# Patient Record
Sex: Female | Born: 1955 | Race: White | Hispanic: No | Marital: Married | Smoking: Never smoker
Health system: Southern US, Community
[De-identification: ages and names within clinical notes are randomized; demographics above are authoritative.]

## PROBLEM LIST (undated history)

## (undated) DIAGNOSIS — E785 Hyperlipidemia, unspecified: Secondary | ICD-10-CM

## (undated) DIAGNOSIS — A6000 Herpesviral infection of urogenital system, unspecified: Secondary | ICD-10-CM

## (undated) DIAGNOSIS — I4510 Unspecified right bundle-branch block: Secondary | ICD-10-CM

## (undated) DIAGNOSIS — S4290XA Fracture of unspecified shoulder girdle, part unspecified, initial encounter for closed fracture: Secondary | ICD-10-CM

## (undated) DIAGNOSIS — B09 Unspecified viral infection characterized by skin and mucous membrane lesions: Secondary | ICD-10-CM

## (undated) DIAGNOSIS — K921 Melena: Secondary | ICD-10-CM

## (undated) DIAGNOSIS — K5900 Constipation, unspecified: Secondary | ICD-10-CM

## (undated) DIAGNOSIS — K219 Gastro-esophageal reflux disease without esophagitis: Secondary | ICD-10-CM

## (undated) DIAGNOSIS — C4491 Basal cell carcinoma of skin, unspecified: Secondary | ICD-10-CM

## (undated) DIAGNOSIS — R319 Hematuria, unspecified: Secondary | ICD-10-CM

## (undated) DIAGNOSIS — M549 Dorsalgia, unspecified: Secondary | ICD-10-CM

## (undated) HISTORY — DX: Hyperlipidemia, unspecified: E78.5

## (undated) HISTORY — DX: Fracture of unspecified shoulder girdle, part unspecified, initial encounter for closed fracture: S42.90XA

## (undated) HISTORY — DX: Unspecified viral infection characterized by skin and mucous membrane lesions: B09

## (undated) HISTORY — DX: Dorsalgia, unspecified: M54.9

## (undated) HISTORY — DX: Gastro-esophageal reflux disease without esophagitis: K21.9

## (undated) HISTORY — PX: BLEPHAROPLASTY: SUR158

## (undated) HISTORY — DX: Melena: K92.1

## (undated) HISTORY — DX: Basal cell carcinoma of skin, unspecified: C44.91

## (undated) HISTORY — PX: ABDOMINAL HYSTERECTOMY: SHX81

## (undated) HISTORY — PX: POLYPECTOMY: SHX149

## (undated) HISTORY — DX: Hematuria, unspecified: R31.9

## (undated) HISTORY — DX: Constipation, unspecified: K59.00

## (undated) HISTORY — DX: Herpesviral infection of urogenital system, unspecified: A60.00

## (undated) HISTORY — DX: Unspecified right bundle-branch block: I45.10

---

## 1999-09-23 ENCOUNTER — Encounter (INDEPENDENT_AMBULATORY_CARE_PROVIDER_SITE_OTHER): Payer: Self-pay

## 1999-09-23 ENCOUNTER — Inpatient Hospital Stay (HOSPITAL_COMMUNITY): Admission: RE | Admit: 1999-09-23 | Discharge: 1999-09-26 | Payer: Self-pay | Admitting: Obstetrics & Gynecology

## 2002-04-05 ENCOUNTER — Other Ambulatory Visit: Admission: RE | Admit: 2002-04-05 | Discharge: 2002-04-05 | Payer: Self-pay | Admitting: Obstetrics & Gynecology

## 2002-11-08 HISTORY — PX: BLADDER SUSPENSION: SHX72

## 2003-04-22 ENCOUNTER — Other Ambulatory Visit: Admission: RE | Admit: 2003-04-22 | Discharge: 2003-04-22 | Payer: Self-pay | Admitting: Obstetrics & Gynecology

## 2004-06-02 ENCOUNTER — Other Ambulatory Visit: Admission: RE | Admit: 2004-06-02 | Discharge: 2004-06-02 | Payer: Self-pay | Admitting: Obstetrics & Gynecology

## 2005-08-04 ENCOUNTER — Other Ambulatory Visit: Admission: RE | Admit: 2005-08-04 | Discharge: 2005-08-04 | Payer: Self-pay | Admitting: Obstetrics & Gynecology

## 2005-11-08 HISTORY — PX: COLONOSCOPY: SHX174

## 2007-08-31 ENCOUNTER — Ambulatory Visit: Payer: Self-pay | Admitting: Internal Medicine

## 2007-10-02 ENCOUNTER — Ambulatory Visit: Payer: Self-pay | Admitting: Internal Medicine

## 2009-10-13 ENCOUNTER — Emergency Department (HOSPITAL_COMMUNITY): Admission: EM | Admit: 2009-10-13 | Discharge: 2009-10-13 | Payer: Self-pay | Admitting: Emergency Medicine

## 2011-02-09 LAB — URINALYSIS, ROUTINE W REFLEX MICROSCOPIC
Glucose, UA: NEGATIVE mg/dL
Ketones, ur: NEGATIVE mg/dL
pH: 5.5 (ref 5.0–8.0)

## 2011-02-09 LAB — CBC
MCHC: 34.3 g/dL (ref 30.0–36.0)
Platelets: 186 10*3/uL (ref 150–400)
RDW: 12.5 % (ref 11.5–15.5)

## 2011-02-09 LAB — URINE CULTURE
Colony Count: NO GROWTH
Culture: NO GROWTH

## 2011-02-09 LAB — COMPREHENSIVE METABOLIC PANEL
ALT: 35 U/L (ref 0–35)
AST: 23 U/L (ref 0–37)
Calcium: 9.7 mg/dL (ref 8.4–10.5)
GFR calc Af Amer: >60 mL/min (ref >60)
Sodium: 142 mEq/L (ref 135–145)
Total Protein: 6.6 g/dL (ref 6.0–8.3)

## 2011-02-09 LAB — POCT CARDIAC MARKERS
CKMB, poc: 1.3 ng/mL (ref 1.0–8.0)
Myoglobin, poc: 54.3 ng/mL (ref 12–200)

## 2011-02-09 LAB — DIFFERENTIAL
Eosinophils Absolute: 0 10*3/uL (ref 0.0–0.7)
Eosinophils Relative: 0 % (ref 0–5)
Lymphs Abs: 2.1 10*3/uL (ref 0.7–4.0)
Monocytes Relative: 7 % (ref 3–12)

## 2011-02-09 LAB — PROTIME-INR: INR: 0.95 (ref 0.00–1.49)

## 2011-03-26 NOTE — Op Note (Signed)
[facility]  Patient:    Pamela Cameron                         MRN: [id number] Proc. Date: 09/23/99 Adm. Date:  [id number] Attending:  Minette Headland                           Operative Report  PREOPERATIVE DIAGNOSES:       Cystocele with prolapse and right paravaginal defect; rectocele with prolapse to the introitus; uterine enlargement; marked uterine descensus.  POSTOPERATIVE DIAGNOSES:      Cystocele with prolapse and right paravaginal defect; rectocele with prolapse to the introitus; uterine enlargement; marked uterine descensus.  OPERATIVE PROCEDURE:          Total abdominal hysterectomy; paravaginal repair;  posterior colporrhaphy.  SURGEON:                      Freddy Finner, M.D.  ASSISTANT:                    Guy Sandifer. Arleta Creek, M.D.  ANESTHESIA:  ESTIMATED INTRAOPERATIVE BLOOD LOSS:  250 to 300 cc.  INTRAOPERATIVE COMPLICATIONS:  None.  INDICATIONS:                  Details of the present illness are recorded in the admission note.  DESCRIPTION OF PROCEDURE:     Patient was admitted on the morning, she was placed in PAS hose and she was given IV bolus of cefotetan.  She was brought to the operating room, placed under adequate general endotracheal anesthesia and placed in the dorsal lithotomy position using the [place] stirrup system.  Betadine prep of  abdomen, perineum, upper thighs and vagina was carried out with Betadine scrub followed by solution.  A 30 cc Foley catheter was placed with a triple lumen. Sterile drapes were applied.  A lower abdominal transverse incision was made and carried sharply down to fascia, which was entered sharply, and extended to the extent of the skin incision.  Rectus sheath was developed superiorly and inferiorly with blunt and sharp dissection.  The rectus muscles were divided in the midline. Subcutaneous and subfascial vessels were controlled with a Bovie.  Peritoneum was entered  bluntly and extended sharply and bluntly to the extent of the skin incision.  Palpation of the upper abdomen revealed no apparent abnormalities of  liver, gallbladder, kidneys or other intra-abdominal abnormalities.  Appendix was inspected and found to be normal and left in place.  Moist packs were used to pack the intestinal contents out of the pelvis and a self-retaining OConnor-OSullivan retractor was used.  The proximal broad ligaments were grasped with Kellys. Round ligaments were taken with suture ligature of 0 Monocryl, divided sharply and the anterior leaf of the broad ligament incised elliptically; this freed the bladder from the lower segment.  Uteroovarian pedicles were developed, cross-clamped with curved Heaneys, divided and doubly ligated with free ties of 0 Monocryl followed by suture ligature of 0 Monocryl.  Vessel pedicles were skeletonized, cross-clamped with Heaneys, divided sharply and ligated with 0 Monocryl.  Straight Heaneys were used to develop the cardinal ligament pedicles; these were divided sharply and ligated with 0 Monocryl.  Curved Heaneys were placed on the uterosacrals on either side, divided sharply and ligated with 0 Monocryl in a Heaney fashion; this included the angle on the left side.  The vaginal angle on the right side was taken separately, divided and ligated with 0 Monocryl in a Heaney fashion, anchoring t to the uterosacral ligament.  Cervix was completely excised from the cuff. Cuff was closed with figure-of-eights of 0 Monocryl.  Plication sutures x 2 of 0 Monocryl were used in the uterosacrals to plicate them and elevate the vaginal apex.  Irrigation was carried out.  All bleeding sources were controlled with Bovie or with clamp and suture.  Each ovary was suspended from the round ligament with interrupted 0 Monocryl suture.  With adequate complete hemostasis, all packs, needles and instruments were removed from the abdomen.  The  peritoneum was closed with a running 0 Monocryl suture.  Extensive dissection in the space of Retzius and paravaginal space was carried out.  With digital vaginal exploration to identify the sponge on each side, the ischial spines were identified.  The dissection was carried down until the vagina was identified and any tissue stripped away from he vagina.  The white line was then identified on the right side and using a total of three paravaginal sutures, the vagina was elevated to the white line.  Each suture was placed while digitally exploring the vaginal canal and was placed into the vesicovaginal fistula and anchored to the white line.  Ethibond 0 sutures were sed on a UR-6 needle.  This was repeated on the left side, placing a suture approximately 2 cm lateral to the urethrovesical angle, which was identified by  placing traction on the 30 cc Foley bulb.  The urethrovesical angle was then anchored to Coopers ligament.  All sutures were then tied and this adequately elevated the bladder and the urethrovesical junction.  Bladder was filled with irrigating solution and a suprapubic Bonnano catheter placed without difficulty; this was anchored to the skin with 4-0 nylon.  Fascia was closed with running 0  PDS, running from angle-to-midline on either side.  Skin was closed with wide skin staples and quarter-inch Steri-Strips.  Foley catheter was removed.  Legs were elevated with the Allen stirrups and attention was turned to the vaginal repair.  The fourchette was grasped with Allis forceps.  A pyramidal-shaped segment of skin was removed from the perineal body with apex inferiorly.  With careful blunt and sharp dissection, the mucosa overlying the rectum was freed and an incision made posteriorly in the midline, while elevating the mucosa. Pararectal tissues were then approximated using interrupted sutures of 0 Monocryl; a total of three sutures were used for this as well  as a fourth suture for reapproximation of the levators.  Mucosa was then closed with a running 2-0 Monocryl suture in a fashion identical to an episiotomy repair.  Hemostasis was adequate.  All packs, needles and instruments were removed.  The patient tolerated the operative  procedure well and was taken to the recovery room in good condition.  DD: 09/23/99 TD:  09/23/99 Job: [id number]

## 2013-01-06 DIAGNOSIS — K921 Melena: Secondary | ICD-10-CM

## 2013-01-06 HISTORY — DX: Melena: K92.1

## 2013-01-16 ENCOUNTER — Telehealth: Payer: Self-pay | Admitting: Internal Medicine

## 2013-01-16 NOTE — Telephone Encounter (Signed)
Spoke with Leotis Shames and scheduled patient on 01/17/13 at 9:00 AM with Doug Sou, PA. Lauren to send records.

## 2013-01-17 ENCOUNTER — Encounter: Payer: Self-pay | Admitting: Gastroenterology

## 2013-01-17 ENCOUNTER — Ambulatory Visit (INDEPENDENT_AMBULATORY_CARE_PROVIDER_SITE_OTHER): Payer: BC Managed Care – PPO | Admitting: Gastroenterology

## 2013-01-17 ENCOUNTER — Encounter: Payer: Self-pay | Admitting: Internal Medicine

## 2013-01-17 VITALS — BP 100/50 | HR 66 | Ht 70.0 in | Wt 173.0 lb

## 2013-01-17 DIAGNOSIS — K922 Gastrointestinal hemorrhage, unspecified: Secondary | ICD-10-CM

## 2013-01-17 MED ORDER — MOVIPREP 100 G PO SOLR: 1.0000 | Freq: Once | ORAL | Status: AC

## 2013-01-17 NOTE — Progress Notes (Signed)
Reviewed and agree. Colonoscopy, but bleeding likely ano-rectal.

## 2013-01-17 NOTE — Progress Notes (Signed)
01/17/2013 Pamela Cameron [id number] 12-04-1955   HISTORY OF PRESENT ILLNESS:  Patient is a pleasant 57 year old female who is a patient of Dr. Regino Schultze.  She had a colonoscopy 5.5 years ago in 09/2007 at which time it was normal, but prep with Miralax was sub-optimal.  She says about a month ago she saw some BRB on the TP.  She saw her PCP who gave her a prescription for anusol suppositories to use, however, she said that she does not like to use anything unless absolutely necessary.  Her PCP also recommended that she be evaluated for a repeat colonoscopy.  She says that she had another episode of bleeding 2 days ago.  None in stool or in the bowl that she can tell.  No abdominal pain or rectal discomfort.  Stools are normal/regular for her without diarrhea or constipation.  Denies family history of colon CA.   Past Medical History  Diagnosis Date  . Carpal tunnel syndrome   . Skin cancer    Past Surgical History  Procedure Laterality Date  . Colonoscopy  2007  . Abdominal hysterectomy      Total hysterectomy with bladder tuck    reports that she has never smoked. She has never used smokeless tobacco. She reports that  drinks alcohol. She reports that she does not use illicit drugs. family history includes CVA in her mother; Emphysema in her father; Hypertension in her mother; and Lung cancer in her mother. No Known Allergies    No outpatient encounter prescriptions on file as of 01/17/2013.   No facility-administered encounter medications on file as of 01/17/2013.     REVIEW OF SYSTEMS  : All other systems reviewed and negative except where noted in the History of Present Illness.   PHYSICAL EXAM: BP 100/50  Pulse 66  Ht 5\' 10"  (1.778 m)  Wt 173 lb (78.472 kg)  BMI 24.82 kg/m2 General: Well developed white female in no acute distress Head: Normocephalic and atraumatic Eyes:  sclerae anicteric,conjunctive pink. Ears: Normal auditory acuity Lungs: Clear throughout to  auscultation Heart: Regular rate and rhythm Abdomen: Soft, nontender, non-distended. No masses or hepatomegaly noted. Normal bowel sounds Rectal: Deferred.  Will be done at the time of colonoscopy. Musculoskeletal: Symmetrical with no gross deformities  Skin: No lesions on visible extremities Extremities: No edema  Neurological: Alert oriented x 4, grossly nonfocal Psychological:  Alert and cooperative. Normal mood and affect  ASSESSMENT AND PLAN: -LGIB:  Intermittent bright red blood C/W outlet bleeding.  Patient's last colonoscopy was 5.5 years ago.  Her PCP recommended colonoscopy and patient would like piece of mind as well.  Last colonoscopy prep was sub-optimal.  Will schedule colonoscopy for re-evaluation.

## 2013-01-17 NOTE — Patient Instructions (Addendum)
You have been scheduled for a colonoscopy with propofol. Please follow written instructions given to you at your visit today.  Please pick up your prep kit at the pharmacy within the next 1-3 days. If you use inhalers (even only as needed), please bring them with you on the day of your procedure.  We sent the prescription for the Moviprep to [facility] [address]

## 2013-01-20 DIAGNOSIS — R319 Hematuria, unspecified: Secondary | ICD-10-CM

## 2013-01-20 HISTORY — DX: Hematuria, unspecified: R31.9

## 2013-02-06 ENCOUNTER — Encounter: Payer: Self-pay | Admitting: Internal Medicine

## 2013-02-06 ENCOUNTER — Ambulatory Visit (AMBULATORY_SURGERY_CENTER): Payer: BC Managed Care – PPO | Admitting: Internal Medicine

## 2013-02-06 VITALS — BP 131/77 | HR 61 | Temp 97.5°F | Resp 18 | Ht 70.0 in | Wt 173.0 lb

## 2013-02-06 DIAGNOSIS — K922 Gastrointestinal hemorrhage, unspecified: Secondary | ICD-10-CM

## 2013-02-06 DIAGNOSIS — D126 Benign neoplasm of colon, unspecified: Secondary | ICD-10-CM

## 2013-02-06 MED ORDER — SODIUM CHLORIDE 0.9 % IV SOLN: 500.0000 mL | INTRAVENOUS | Status: DC

## 2013-02-06 NOTE — Patient Instructions (Addendum)
YOU HAD AN ENDOSCOPIC PROCEDURE TODAY AT THE [facility]: Refer to the procedure report that was given to you for any specific questions about what was found during the examination.  If the procedure report does not answer your questions, please call your gastroenterologist to clarify.  If you requested that your care partner not be given the details of your procedure findings, then the procedure report has been included in a sealed envelope for you to review at your convenience later.  YOU SHOULD EXPECT: Some feelings of bloating in the abdomen. Passage of more gas than usual.  Walking can help get rid of the air that was put into your GI tract during the procedure and reduce the bloating. If you had a lower endoscopy (such as a colonoscopy or flexible sigmoidoscopy) you may notice spotting of blood in your stool or on the toilet paper. If you underwent a bowel prep for your procedure, then you may not have a normal bowel movement for a few days.  DIET: Your first meal following the procedure should be a light meal and then it is ok to progress to your normal diet.  A half-sandwich or bowl of soup is an example of a good first meal.  Heavy or fried foods are harder to digest and may make you feel nauseous or bloated.  Likewise meals heavy in dairy and vegetables can cause extra gas to form and this can also increase the bloating.  Drink plenty of fluids but you should avoid alcoholic beverages for 24 hours.  Eat a high fiber diet.  ACTIVITY: Your care partner should take you home directly after the procedure.  You should plan to take it easy, moving slowly for the rest of the day.  You can resume normal activity the day after the procedure however you should NOT DRIVE or use heavy machinery for 24 hours (because of the sedation medicines used during the test).    SYMPTOMS TO REPORT IMMEDIATELY: A gastroenterologist can be reached at any hour.  During normal business hours, 8:30 AM to 5:00 PM  Monday through Friday, call [phone].  After hours and on weekends, please call the GI answering service at [phone] who will take a message and have the physician on call contact you.   Following lower endoscopy (colonoscopy or flexible sigmoidoscopy):  Excessive amounts of blood in the stool  Significant tenderness or worsening of abdominal pains  Swelling of the abdomen that is new, acute  Fever of 100F or higher  FOLLOW UP: If any biopsies were taken you will be contacted by phone or by letter within the next 1-3 weeks.  Call your gastroenterologist if you have not heard about the biopsies in 3 weeks.  Our staff will call the home number listed on your records the next business day following your procedure to check on you and address any questions or concerns that you may have at that time regarding the information given to you following your procedure. This is a courtesy call and so if there is no answer at the home number and we have not heard from you through the emergency physician on call, we will assume that you have returned to your regular daily activities without incident.  SIGNATURES/CONFIDENTIALITY: You and/or your care partner have signed paperwork which will be entered into your electronic medical record.  These signatures attest to the fact that that the information above on your After Visit Summary has been reviewed and is understood.  Full   responsibility of the confidentiality of this discharge information lies with you and/or your care-partner. 

## 2013-02-06 NOTE — Op Note (Signed)
[facility] [address] [place], [address]   COLONOSCOPY PROCEDURE REPORT  PATIENT: Pamela Cameron, Pamela Cameron  MR#: [id number] BIRTHDATE: 04-05-1956 , 56  yrs. old GENDER: Female ENDOSCOPIST: Hart Carwin, MD REFERRED BY:  Theressa Millard, M.D. PROCEDURE DATE:  02/06/2013 PROCEDURE:   Colonoscopy with snare polypectomy ASA CLASS:   Class II INDICATIONS:hematochezia and colonoscopy 2008- suboptimal prep, pt asked to come back in 5 years. MEDICATIONS: MAC sedation, administered by CRNA, propofol (Diprivan) 500mg  IV, Atropine .4 mg IV, and Robinul 0.2 mg IV  DESCRIPTION OF PROCEDURE:   After the risks and benefits and of the procedure were explained, informed consent was obtained.  A digital rectal exam revealed no abnormalities of the rectum.    The LB CF-[id number]  endoscope was introduced through the anus and advanced to the cecum, which was identified by both the appendix and ileocecal valve .  The quality of the prep was good, using MoviPrep .  The instrument was then slowly withdrawn as the colon was fully examined.     COLON FINDINGS: A polypoid shaped pedunculated polyp measuring 20 mm in size with a friable surface was found in the sigmoid colon.  A polypectomy was performed using snare cautery.  The resection was complete and the polyp tissue was completely retrieved. Retroflexed views revealed no abnormalities.     The scope was then withdrawn from the patient and the procedure completed.  COMPLICATIONS: There were no complications. ENDOSCOPIC IMPRESSION: Pedunculated polyp measuring 20 mm in size was found in the sigmoid colon; polypectomy was performed using snare cautery , polyp was likely source of low volume hematochezia  RECOMMENDATIONS: 1.  Await pathology results 2.  high fiber diet   REPEAT EXAM: In 5 year(s)  for Colonoscopy. , pending pathology report  cc:  _______________________________ eSigned:  Hart Carwin, MD 02/06/2013  10:53 AM     PATIENT NAME:  Pamela Cameron, Pamela Cameron MR#: [id number]

## 2013-02-06 NOTE — Progress Notes (Addendum)
Patient did not have preoperative order for IV antibiotic SSI prophylaxis. (G8918)  Patient did not experience any of the following events: a burn prior to discharge; a fall within the facility; wrong site/side/patient/procedure/implant event; or a hospital transfer or hospital admission upon discharge from the facility. (G8907)  

## 2013-02-06 NOTE — Progress Notes (Signed)
Pt. First noticed blood in stool early March.  She visited Dr. After approx 3 episodes of bleeding.  Approx. Middle of march notice  Clots in urine on three separate occasions.

## 2013-02-07 ENCOUNTER — Telehealth: Payer: Self-pay

## 2013-02-07 NOTE — Telephone Encounter (Signed)
  Follow up Call-  Call back number 02/06/2013  Post procedure Call Back phone  # [phone]  Permission to leave phone message Yes     Patient questions:  Do you have a fever, pain , or abdominal swelling? no Pain Score  0 *  Have you tolerated food without any problems? yes  Have you been able to return to your normal activities? yes  Do you have any questions about your discharge instructions: Diet   no Medications  no Follow up visit  no  Do you have questions or concerns about your Care? no  Actions: * If pain score is 4 or above: No action needed, pain <4.

## 2013-02-08 ENCOUNTER — Encounter: Payer: Self-pay | Admitting: Internal Medicine

## 2013-02-13 ENCOUNTER — Encounter: Payer: Self-pay | Admitting: Internal Medicine

## 2013-02-14 ENCOUNTER — Encounter: Payer: Self-pay | Admitting: *Deleted

## 2013-02-27 ENCOUNTER — Telehealth: Payer: Self-pay | Admitting: Internal Medicine

## 2013-02-27 NOTE — Telephone Encounter (Signed)
Patient given results as per letter from 02/14/13. Mailed path results letter again. Patient understands to call me if she does not receive it.

## 2013-07-05 ENCOUNTER — Other Ambulatory Visit: Payer: Self-pay | Admitting: Dermatology

## 2013-12-04 ENCOUNTER — Other Ambulatory Visit: Payer: Self-pay | Admitting: Dermatology

## 2014-05-04 DIAGNOSIS — S4290XA Fracture of unspecified shoulder girdle, part unspecified, initial encounter for closed fracture: Secondary | ICD-10-CM

## 2014-05-04 HISTORY — DX: Fracture of unspecified shoulder girdle, part unspecified, initial encounter for closed fracture: S42.90XA

## 2014-07-17 ENCOUNTER — Other Ambulatory Visit: Payer: Self-pay | Admitting: Dermatology

## 2015-07-10 ENCOUNTER — Encounter: Payer: Self-pay | Admitting: Family Medicine

## 2015-07-10 ENCOUNTER — Ambulatory Visit (INDEPENDENT_AMBULATORY_CARE_PROVIDER_SITE_OTHER): Payer: Managed Care, Other (non HMO) | Admitting: Family Medicine

## 2015-07-10 VITALS — BP 96/60 | HR 86 | Temp 97.6°F | Ht 69.5 in | Wt 176.3 lb

## 2015-07-10 DIAGNOSIS — Z7189 Other specified counseling: Secondary | ICD-10-CM | POA: Diagnosis not present

## 2015-07-10 DIAGNOSIS — Z7689 Persons encountering health services in other specified circumstances: Secondary | ICD-10-CM

## 2015-07-10 DIAGNOSIS — Z23 Encounter for immunization: Secondary | ICD-10-CM | POA: Diagnosis not present

## 2015-07-10 DIAGNOSIS — M545 Low back pain, unspecified: Secondary | ICD-10-CM

## 2015-07-10 DIAGNOSIS — G8929 Other chronic pain: Secondary | ICD-10-CM | POA: Insufficient documentation

## 2015-07-10 DIAGNOSIS — Z0000 Encounter for general adult medical examination without abnormal findings: Secondary | ICD-10-CM

## 2015-07-10 NOTE — Patient Instructions (Signed)
BEFORE YOU LEAVE: -low back exercises -shingles vaccine today -schedule fasting lab appointment and nurse visit for flu vaccine in next few weeks -follow up yearly and in 1 month if back pain persists  Do the back exercises 4 days per week and follow up if persists in 4 weeks  -We have ordered labs or studies at this visit. It can take up to 1-2 weeks for results and processing. We will contact you with instructions IF your results are abnormal. Normal results will be released to your [facility]. If you have not heard from [place] or can not find your results in [facility] in 2 weeks please contact our office.  We recommend the following healthy lifestyle measures: - eat a healthy diet consisting of lots of vegetables, fruits, beans, nuts, seeds, healthy meats such as white chicken and fish and whole grains.  - avoid fried foods, fast food, processed foods, sodas, red meet and other fattening foods.  - get a least 150 minutes of aerobic exercise per week.
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
## 2015-07-10 NOTE — Progress Notes (Signed)
Pre visit review using our clinic review tool, if applicable. No additional management support is needed unless otherwise documented below in the visit note. 

## 2015-07-24 ENCOUNTER — Other Ambulatory Visit (INDEPENDENT_AMBULATORY_CARE_PROVIDER_SITE_OTHER): Payer: Managed Care, Other (non HMO)

## 2015-07-24 DIAGNOSIS — Z0000 Encounter for general adult medical examination without abnormal findings: Secondary | ICD-10-CM

## 2015-07-24 DIAGNOSIS — E785 Hyperlipidemia, unspecified: Secondary | ICD-10-CM

## 2015-07-24 LAB — TSH: TSH: 2.71 u[IU]/mL (ref 0.35–4.50)

## 2015-07-24 LAB — LIPID PANEL
CHOL/HDL RATIO: 3
Cholesterol: 252 mg/dL — ABNORMAL HIGH (ref 0–200)
HDL: 74.9 mg/dL (ref >39.00)
LDL Cholesterol: 162 mg/dL — ABNORMAL HIGH (ref 0–99)
NONHDL: 177.55
Triglycerides: 78 mg/dL (ref 0.0–149.0)
VLDL: 15.6 mg/dL (ref 0.0–40.0)

## 2015-07-24 LAB — HEMOGLOBIN A1C: HEMOGLOBIN A1C: 5.2 % (ref 4.6–6.5)

## 2015-11-19 ENCOUNTER — Other Ambulatory Visit (INDEPENDENT_AMBULATORY_CARE_PROVIDER_SITE_OTHER): Payer: Managed Care, Other (non HMO)

## 2015-11-19 DIAGNOSIS — E785 Hyperlipidemia, unspecified: Secondary | ICD-10-CM

## 2015-11-19 LAB — LIPID PANEL
CHOL/HDL RATIO: 4
Cholesterol: 239 mg/dL — ABNORMAL HIGH (ref 0–200)
HDL: 58.8 mg/dL (ref >39.00)
LDL Cholesterol: 162 mg/dL — ABNORMAL HIGH (ref 0–99)
NONHDL: 180.06
TRIGLYCERIDES: 88 mg/dL (ref 0.0–149.0)
VLDL: 17.6 mg/dL (ref 0.0–40.0)

## 2016-01-21 ENCOUNTER — Ambulatory Visit: Payer: Managed Care, Other (non HMO) | Admitting: Family Medicine

## 2016-02-04 ENCOUNTER — Ambulatory Visit (INDEPENDENT_AMBULATORY_CARE_PROVIDER_SITE_OTHER): Payer: Managed Care, Other (non HMO) | Admitting: Family Medicine

## 2016-02-04 ENCOUNTER — Encounter: Payer: Self-pay | Admitting: Family Medicine

## 2016-02-04 VITALS — BP 100/64 | HR 63 | Temp 97.9°F | Ht 69.5 in | Wt 174.7 lb

## 2016-02-04 DIAGNOSIS — E785 Hyperlipidemia, unspecified: Secondary | ICD-10-CM | POA: Diagnosis not present

## 2016-02-04 NOTE — Patient Instructions (Addendum)
BEFORE YOU LEAVE: -schedule lab visit in 2 months - come fasting  We recommend the following healthy lifestyle measures: - eat a healthy whole foods diet consisting of regular small meals composed of vegetables, fruits, beans, nuts, seeds, healthy meats such as white chicken and fish and whole grains.  - avoid sweets, white starchy foods, fried foods, fast food, processed foods, sodas, red meet and other fattening foods.  - get a least 150-300 minutes of aerobic exercise per week.

## 2016-02-04 NOTE — Progress Notes (Signed)
Pre visit review using our clinic review tool, if applicable. No additional management support is needed unless otherwise documented below in the visit note. 
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
## 2016-02-05 ENCOUNTER — Telehealth: Payer: Self-pay | Admitting: Internal Medicine

## 2016-02-05 NOTE — Telephone Encounter (Signed)
Spoke with patient and she is asking for Dr. Loletha Carrow as her new GI. She is calling because her sister recently was diagnosed with uterine cancer "after having blood in her stool one time." Patient states she remembers having BRB in her stool once 4-5 months ago.No bleeding since this time. She is concerned about this since she had a pre cancerous polyp with last colonoscopy. Reviewed colonoscopy results with patient and reassured her about her results. Offered to schedule OV but she declines.She understands to call if bleeding occurs again or has black stool, maroon stool, coffee like stool. Patient states she has been having lots of gas. Mailed out information on gas/gast prevention diet.

## 2016-04-09 ENCOUNTER — Other Ambulatory Visit: Payer: Managed Care, Other (non HMO)

## 2016-04-14 ENCOUNTER — Other Ambulatory Visit (INDEPENDENT_AMBULATORY_CARE_PROVIDER_SITE_OTHER): Payer: Managed Care, Other (non HMO)

## 2016-04-14 DIAGNOSIS — E785 Hyperlipidemia, unspecified: Secondary | ICD-10-CM

## 2016-04-14 LAB — LIPID PANEL
CHOL/HDL RATIO: 4
Cholesterol: 235 mg/dL — ABNORMAL HIGH (ref 0–200)
HDL: 59.4 mg/dL (ref >39.00)
LDL CALC: 157 mg/dL — AB (ref 0–99)
NonHDL: 175.94
TRIGLYCERIDES: 97 mg/dL (ref 0.0–149.0)
VLDL: 19.4 mg/dL (ref 0.0–40.0)

## 2016-09-06 ENCOUNTER — Other Ambulatory Visit: Payer: Managed Care, Other (non HMO)

## 2016-09-07 ENCOUNTER — Other Ambulatory Visit (INDEPENDENT_AMBULATORY_CARE_PROVIDER_SITE_OTHER): Payer: Managed Care, Other (non HMO)

## 2016-09-07 DIAGNOSIS — E785 Hyperlipidemia, unspecified: Secondary | ICD-10-CM | POA: Diagnosis not present

## 2016-09-07 LAB — LIPID PANEL
CHOL/HDL RATIO: 3
Cholesterol: 208 mg/dL — ABNORMAL HIGH (ref 0–200)
HDL: 63.4 mg/dL (ref >39.00)
LDL CALC: 129 mg/dL — AB (ref 0–99)
NONHDL: 144.47
Triglycerides: 77 mg/dL (ref 0.0–149.0)
VLDL: 15.4 mg/dL (ref 0.0–40.0)
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
## 2016-12-14 ENCOUNTER — Encounter: Payer: Self-pay | Admitting: Family Medicine

## 2016-12-14 ENCOUNTER — Ambulatory Visit (INDEPENDENT_AMBULATORY_CARE_PROVIDER_SITE_OTHER): Payer: Managed Care, Other (non HMO) | Admitting: Family Medicine

## 2016-12-14 VITALS — BP 100/64 | HR 71 | Temp 98.0°F | Ht 69.5 in | Wt 186.0 lb

## 2016-12-14 DIAGNOSIS — Z6827 Body mass index (BMI) 27.0-27.9, adult: Secondary | ICD-10-CM

## 2016-12-14 DIAGNOSIS — E785 Hyperlipidemia, unspecified: Secondary | ICD-10-CM

## 2016-12-14 NOTE — Progress Notes (Signed)
Pre visit review using our clinic review tool, if applicable. No additional management support is needed unless otherwise documented below in the visit note. 

## 2016-12-14 NOTE — Patient Instructions (Signed)
BEFORE YOU LEAVE: -follow up: Physical in 3 months with fasting labs that day   We recommend the following healthy lifestyle for LIFE: 1) Small portions.   Tip: eat off of a salad plate instead of a dinner plate.  Tip: if you need more or a snack choose fruits, veggies and/or a handful of nuts or seeds.  2) Eat a healthy clean diet.  * Tip: Avoid (less then 1 serving per week): processed foods, sweets, sweetened drinks, white starches (rice, flour, bread, potatoes, pasta, etc), red meat, fast foods, butter  *Tip: CHOOSE instead   * 5-9 servings per day of fresh or frozen fruits and vegetables (but not corn, potatoes, bananas, canned or dried fruit)   *nuts and seeds, beans   *olives and olive oil   *small portions of lean meats such as fish and white chicken    *small portions of whole grains  3)Get at least 150 minutes of sweaty aerobic exercise per week.  4)Reduce stress - consider counseling, meditation and relaxation to balance other aspects of your life.
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
## 2017-03-11 ENCOUNTER — Encounter: Payer: Self-pay | Admitting: Family Medicine

## 2017-03-11 ENCOUNTER — Ambulatory Visit (INDEPENDENT_AMBULATORY_CARE_PROVIDER_SITE_OTHER): Payer: 59 | Admitting: Family Medicine

## 2017-03-11 VITALS — BP 108/62 | HR 61 | Temp 97.6°F | Ht 69.5 in | Wt 186.1 lb

## 2017-03-11 DIAGNOSIS — Z0000 Encounter for general adult medical examination without abnormal findings: Secondary | ICD-10-CM

## 2017-03-11 DIAGNOSIS — Z7289 Other problems related to lifestyle: Secondary | ICD-10-CM

## 2017-03-11 DIAGNOSIS — E785 Hyperlipidemia, unspecified: Secondary | ICD-10-CM

## 2017-03-11 LAB — LIPID PANEL
CHOLESTEROL: 297 mg/dL — AB (ref 0–200)
HDL: 63.7 mg/dL (ref >39.00)
LDL Cholesterol: 205 mg/dL — ABNORMAL HIGH (ref 0–99)
NonHDL: 233.68
TRIGLYCERIDES: 145 mg/dL (ref 0.0–149.0)
Total CHOL/HDL Ratio: 5
VLDL: 29 mg/dL (ref 0.0–40.0)

## 2017-03-11 LAB — HEMOGLOBIN A1C: Hgb A1c MFr Bld: 5.5 % (ref 4.6–6.5)

## 2017-03-11 NOTE — Progress Notes (Signed)
Pre visit review using our clinic review tool, if applicable. No additional management support is needed unless otherwise documented below in the visit note. 

## 2017-03-11 NOTE — Patient Instructions (Signed)
BEFORE YOU LEAVE: -follow up: CPE in 1 year -labs  Vit D3 [phone] IU daily ([employer] brand was highly rated by consumer labs)  We have ordered labs or studies at this visit. It can take up to 1-2 weeks for results and processing. IF results require follow up or explanation, we will call you with instructions. Clinically stable results will be released to your [facility]. If you have not heard from [place] or cannot find your results in [facility] in 2 weeks please contact our office at [phone].  If you are not yet signed up for [facility], please consider signing up.   We recommend the following healthy lifestyle for LIFE: 1) Small portions.   Tip: eat off of a salad plate instead of a dinner plate.  Tip: It is ok to feel hungry after a meal of proper portion size  Tip: if you need more or a snack choose fruits, veggies and/or a handful of nuts or seeds.  2) Eat a healthy clean diet.  * Tip: Avoid (less then 1 serving per week): processed foods, sweets, sweetened drinks, white starches (rice, flour, bread, potatoes, pasta, etc), red meat, fast foods, butter  *Tip: CHOOSE instead   * 5-9 servings per day of fresh or frozen fruits and vegetables (but not corn, potatoes, bananas, canned or dried fruit)   *nuts and seeds, beans   *olives and olive oil   *small portions of lean meats such as fish and white chicken    *small portions of whole grains  3)Get at least 150 minutes of sweaty aerobic exercise per week.  4)Reduce stress - consider counseling, meditation and relaxation to balance other aspects of your life.   WE NOW OFFER   [facility] FAST TRACK!!!  SAME DAY Appointments for ACUTE CARE  Such as: Sprains, Injuries, cuts, abrasions, rashes, muscle pain, joint pain, back pain Colds, flu, sore throats, headache, allergies, cough, fever  Ear pain, sinus and eye infections Abdominal pain, nausea, vomiting, diarrhea, upset stomach Animal/insect bites  3 Easy Ways to  Schedule: Walk-In Scheduling Call in scheduling Mychart Sign-up: [email or web address]

## 2017-03-12 LAB — HEPATITIS C ANTIBODY: HCV AB: NEGATIVE

## 2017-03-15 MED ORDER — ROSUVASTATIN CALCIUM 20 MG PO TABS: 20.0000 mg | ORAL_TABLET | Freq: Every day | ORAL | 3 refills | Status: DC

## 2017-03-15 NOTE — Addendum Note (Signed)
Addended by: Agnes Lawrence on: 03/15/2017 03:03 PM   Modules accepted: Orders

## 2017-05-04 ENCOUNTER — Other Ambulatory Visit: Payer: Self-pay | Admitting: Family Medicine

## 2017-05-04 MED ORDER — ROSUVASTATIN CALCIUM 20 MG PO TABS: 20.0000 mg | ORAL_TABLET | Freq: Every day | ORAL | 2 refills | Status: DC

## 2017-05-04 NOTE — Telephone Encounter (Signed)
Received a fax from the pharmacy requesting a 90 day supply.  Sent to the pharmacy by e-scribe.  Pt had physical and labs in May 2018.  Asked to return in 1 year.

## 2017-05-30 ENCOUNTER — Telehealth: Payer: Self-pay | Admitting: Family Medicine

## 2017-05-30 ENCOUNTER — Encounter: Payer: Self-pay | Admitting: *Deleted

## 2017-05-30 NOTE — Telephone Encounter (Signed)
° ° °  Pt call to say she need a note stating that she is ok to have the yellow fever vaccine  [phone]

## 2017-05-30 NOTE — Telephone Encounter (Signed)
I am not sure what would require this type of note? Ok to type letter that states that:  "In regards to patient Pamela Cameron, DOB [birth year], I am not aware of any medical contraindications or increased risks for adverse events related to obtaining the Yellow Fever Vaccine other then her age of 10. Per CDC recommendations, patients > 62 years of age may be at increased risks for adverse effects related to this vaccine. Advise that the patient review and discuss vaccine indications, contraindications and possible adverse effects with the clinic or provider where she is receiving this vaccine."  Thanks.

## 2017-05-30 NOTE — Telephone Encounter (Signed)
I called the pt and read to her the message below.  Letter was completed and left at the front desk and the pt is aware she can pick this up.

## 2017-06-08 ENCOUNTER — Ambulatory Visit (INDEPENDENT_AMBULATORY_CARE_PROVIDER_SITE_OTHER): Payer: 59

## 2017-06-08 DIAGNOSIS — Z23 Encounter for immunization: Secondary | ICD-10-CM | POA: Diagnosis not present

## 2017-06-08 NOTE — Progress Notes (Signed)
Pt received her Hep B 3rd  injection today 06/08/2017 at 11am, Pt brought immunization records from [facility] and a copy was made. Pt stated that [place]. Given by Rosealee Albee CMA.

## 2017-07-14 ENCOUNTER — Other Ambulatory Visit (INDEPENDENT_AMBULATORY_CARE_PROVIDER_SITE_OTHER): Payer: 59

## 2017-07-14 DIAGNOSIS — E785 Hyperlipidemia, unspecified: Secondary | ICD-10-CM | POA: Diagnosis not present

## 2017-07-14 LAB — LIPID PANEL
CHOL/HDL RATIO: 3
Cholesterol: 148 mg/dL (ref 0–200)
HDL: 54.4 mg/dL (ref >39.00)
LDL Cholesterol: 74 mg/dL (ref 0–99)
NONHDL: 93.96
Triglycerides: 101 mg/dL (ref 0.0–149.0)
VLDL: 20.2 mg/dL (ref 0.0–40.0)
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
## 2017-07-18 ENCOUNTER — Ambulatory Visit (INDEPENDENT_AMBULATORY_CARE_PROVIDER_SITE_OTHER): Payer: 59 | Admitting: Family Medicine

## 2017-07-18 ENCOUNTER — Encounter: Payer: Self-pay | Admitting: Family Medicine

## 2017-07-18 VITALS — BP 90/68 | HR 70 | Temp 97.9°F | Ht 69.5 in | Wt 182.6 lb

## 2017-07-18 DIAGNOSIS — E785 Hyperlipidemia, unspecified: Secondary | ICD-10-CM | POA: Diagnosis not present

## 2017-07-18 NOTE — Patient Instructions (Signed)
BEFORE YOU LEAVE: -follow up: 6 months -set up nurse visit for flu shot  Continue the crestor, healthy diet and regular exercise!  We recommend the following healthy lifestyle for LIFE: 1) Small portions.   Tip: eat off of a salad plate instead of a dinner plate.  Tip: It is ok to feel hungry after a meal - that likely means you ate an appropriate portion.  Tip: if you need more or a snack choose fruits, veggies and/or a handful of nuts or seeds.  2) Eat a healthy clean diet.   TRY TO EAT: -at least 5-7 servings of low sugar vegetables per day (not corn, potatoes or bananas.) -berries are the best choice if you wish to eat fruit.   -lean meets (fish, chicken or [place] breasts) -vegan proteins for some meals - beans or tofu, whole grains, nuts and seeds -Replace bad fats with good fats - good fats include: fish, nuts and seeds, canola oil, olive oil -small amounts of low fat or non fat dairy -small amounts of100 % whole grains - check the lables  AVOID: -SUGAR, sweets, anything with added sugar, corn syrup or sweeteners -if you must have a sweetener, small amounts of stevia may be best -sweetened beverages -simple starches (rice, bread, potatoes, pasta, chips, etc - small amounts of 100% whole grains are ok) -red meat, pork, butter -fried foods, fast food, processed food, excessive dairy, eggs and coconut.  3)Get at least 150 minutes of sweaty aerobic exercise per week.  4)Reduce stress - consider counseling, meditation and relaxation to balance other aspects of your life.

## 2017-08-04 ENCOUNTER — Ambulatory Visit (INDEPENDENT_AMBULATORY_CARE_PROVIDER_SITE_OTHER): Payer: 59 | Admitting: *Deleted

## 2017-08-04 DIAGNOSIS — Z23 Encounter for immunization: Secondary | ICD-10-CM | POA: Diagnosis not present

## 2017-10-10 ENCOUNTER — Other Ambulatory Visit: Payer: Self-pay | Admitting: *Deleted

## 2017-10-10 MED ORDER — ROSUVASTATIN CALCIUM 20 MG PO TABS: 20.0000 mg | ORAL_TABLET | Freq: Every day | ORAL | 2 refills | Status: DC

## 2017-10-10 NOTE — Telephone Encounter (Signed)
Rx done. 

## 2017-12-21 ENCOUNTER — Encounter: Payer: Self-pay | Admitting: Gastroenterology

## 2018-01-16 ENCOUNTER — Ambulatory Visit: Payer: 59 | Admitting: Family Medicine
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
## 2018-02-14 ENCOUNTER — Encounter: Payer: Self-pay | Admitting: Family Medicine

## 2018-02-14 ENCOUNTER — Ambulatory Visit (INDEPENDENT_AMBULATORY_CARE_PROVIDER_SITE_OTHER): Payer: 59 | Admitting: Family Medicine

## 2018-02-14 VITALS — BP 110/68 | HR 71 | Temp 97.8°F | Ht 69.5 in | Wt 197.2 lb

## 2018-02-14 DIAGNOSIS — E785 Hyperlipidemia, unspecified: Secondary | ICD-10-CM | POA: Diagnosis not present

## 2018-02-14 DIAGNOSIS — Z6828 Body mass index (BMI) 28.0-28.9, adult: Secondary | ICD-10-CM | POA: Diagnosis not present

## 2018-02-14 DIAGNOSIS — K219 Gastro-esophageal reflux disease without esophagitis: Secondary | ICD-10-CM

## 2018-02-14 DIAGNOSIS — L719 Rosacea, unspecified: Secondary | ICD-10-CM | POA: Diagnosis not present

## 2018-02-14 NOTE — Patient Instructions (Signed)
Physical in about 3-4 months, we will plan to check labs then   We recommend the following healthy lifestyle for LIFE: 1) Small portions. But, make sure to get regular (at least 3 per day), healthy meals and small healthy snacks if needed.  2) Eat a healthy clean diet.   TRY TO EAT: -at least 5-7 servings of low sugar, colorful, and nutrient rich vegetables per day (not corn, potatoes or bananas.) -berries are the best choice if you wish to eat fruit (only eat small amounts if trying to reduce weight)  -lean meets (fish, white meat of chicken or [place]) -vegan proteins for some meals - beans or tofu, whole grains, nuts and seeds -Replace bad fats with good fats - good fats include: fish, nuts and seeds, canola oil, olive oil -small amounts of low fat or non fat dairy -small amounts of100 % whole grains - check the lables -drink plenty of water  AVOID: -SUGAR, sweets, anything with added sugar, corn syrup or sweeteners - must read labels as even foods advertised as "healthy" often are loaded with sugar -if you must have a sweetener, small amounts of stevia may be best -sweetened beverages and artificially sweetened beverages -simple starches (rice, bread, potatoes, pasta, chips, etc - small amounts of 100% whole grains are ok) -red meat, pork, butter -fried foods, fast food, processed food, excessive dairy, eggs and coconut.  3)Get at least 150 minutes of sweaty aerobic exercise per week.  4)Reduce stress - consider counseling, meditation and relaxation to balance other aspects of your life.

## 2018-02-15 ENCOUNTER — Ambulatory Visit (AMBULATORY_SURGERY_CENTER): Payer: Self-pay | Admitting: *Deleted

## 2018-02-15 ENCOUNTER — Encounter: Payer: Self-pay | Admitting: Gastroenterology

## 2018-02-15 ENCOUNTER — Other Ambulatory Visit: Payer: Self-pay

## 2018-02-15 VITALS — Ht 70.0 in | Wt 194.0 lb

## 2018-02-15 DIAGNOSIS — Z86010 Personal history of colonic polyps: Secondary | ICD-10-CM

## 2018-02-15 MED ORDER — NA SULFATE-K SULFATE-MG SULF 17.5-3.13-1.6 GM/177ML PO SOLN: 1.0000 | Freq: Once | ORAL | 0 refills | Status: AC

## 2018-02-15 NOTE — Progress Notes (Signed)
No egg or soy allergy known to patient  No issues with past sedation with any surgeries  or procedures, no intubation problems  No diet pills per patient No home 02 use per patient  No blood thinners per patient  Pt denies issues with constipation  No A fib or A flutter  EMMI video sent to pt's e mail - pt declined  Pt states her last 2 colons she had sub optimal preps and she is requesting a more extensive prep- gave her a 2 day suprep at her request

## 2018-03-01 ENCOUNTER — Ambulatory Visit (AMBULATORY_SURGERY_CENTER): Payer: 59 | Admitting: Gastroenterology

## 2018-03-01 ENCOUNTER — Encounter: Payer: Self-pay | Admitting: Gastroenterology

## 2018-03-01 ENCOUNTER — Other Ambulatory Visit: Payer: Self-pay

## 2018-03-01 VITALS — BP 107/58 | HR 55 | Temp 97.3°F | Resp 11 | Ht 70.0 in | Wt 194.0 lb

## 2018-03-01 DIAGNOSIS — Z860100 Personal history of colon polyps, unspecified: Secondary | ICD-10-CM

## 2018-03-01 DIAGNOSIS — D125 Benign neoplasm of sigmoid colon: Secondary | ICD-10-CM | POA: Diagnosis not present

## 2018-03-01 DIAGNOSIS — D123 Benign neoplasm of transverse colon: Secondary | ICD-10-CM

## 2018-03-01 MED ORDER — SODIUM CHLORIDE 0.9 % IV SOLN: 500.0000 mL | Freq: Once | INTRAVENOUS | Status: DC

## 2018-03-01 NOTE — Progress Notes (Signed)
Called to room to assist during endoscopic procedure.  Patient ID and intended procedure confirmed with present staff. Received instructions for my participation in the procedure from the performing physician.  

## 2018-03-01 NOTE — Progress Notes (Signed)
Report given to PACU, vss 

## 2018-03-01 NOTE — Op Note (Signed)
[facility] Patient Name: Pamela Cameron Procedure Date: 03/01/2018 9:59 AM MRN: [id number] Endoscopist: Mauri Pole , MD Age: 63 Referring MD:  Date of Birth: February 26, 1956 Gender: Female Account #: [id number] Procedure:                Colonoscopy Indications:              High risk colon cancer surveillance: Personal                            history of colonic polyps, Surveillance: Personal                            history of adenomatous polyps on last colonoscopy 5                            years ago, High risk colon cancer surveillance:                            Personal history of adenoma (10 mm or greater in                            size) Medicines:                Monitored Anesthesia Care Procedure:                Pre-Anesthesia Assessment:                           - Prior to the procedure, a History and Physical                            was performed, and patient medications and                            allergies were reviewed. The patient's tolerance of                            previous anesthesia was also reviewed. The risks                            and benefits of the procedure and the sedation                            options and risks were discussed with the patient.                            All questions were answered, and informed consent                            was obtained. Prior Anticoagulants: The patient has                            taken no previous anticoagulant or antiplatelet  agents. ASA Grade Assessment: II - A patient with                            mild systemic disease. After reviewing the risks                            and benefits, the patient was deemed in                            satisfactory condition to undergo the procedure.                           After obtaining informed consent, the colonoscope                            was passed under direct vision. Throughout the                    procedure, the patient's blood pressure, pulse, and                            oxygen saturations were monitored continuously. The                            Colonoscope was introduced through the anus and                            advanced to the the cecum, identified by                            appendiceal orifice and ileocecal valve. The                            colonoscopy was performed without difficulty. The                            patient tolerated the procedure well. The quality                            of the bowel preparation was good. The ileocecal                            valve, appendiceal orifice, and rectum were                            photographed. Scope In: 10:05:54 AM Scope Out: 10:26:21 AM Scope Withdrawal Time: 0 hours 14 minutes 10 seconds  Total Procedure Duration: 0 hours 20 minutes 27 seconds  Findings:                 The perianal and digital rectal examinations were                            normal.  Two sessile polyps were found in the sigmoid colon                            and transverse colon. The polyps were 4 to 5 mm in                            size. These polyps were removed with a cold snare.                            Resection was complete, but the polyp tissue was                            only partially retrieved.                           A few small-mouthed diverticula were found in the                            sigmoid colon.                           Non-bleeding internal hemorrhoids were found during                            retroflexion. The hemorrhoids were small. Complications:            No immediate complications. Estimated Blood Loss:     Estimated blood loss was minimal. Impression:               - Two 4 to 5 mm polyps in the sigmoid colon and in                            the transverse colon, removed with a cold snare.                            Complete resection. Partial  retrieval.                           - Mild diverticulosis in the sigmoid colon.                           - Non-bleeding internal hemorrhoids. Recommendation:           - Patient has a contact number available for                            emergencies. The signs and symptoms of potential                            delayed complications were discussed with the                            patient. Return to normal activities tomorrow.  Written discharge instructions were provided to the                            patient.                           - Resume previous diet.                           - Continue present medications.                           - Await pathology results.                           - Repeat colonoscopy in 5 years for surveillance                            based on pathology results. Mauri Pole, MD 03/01/2018 10:30:02 AM This report has been signed electronically.

## 2018-03-01 NOTE — Progress Notes (Signed)
Pt's states no medical or surgical changes since previsit or office visit.Pt's states no medical or surgical changes since previsit or office visit. 

## 2018-03-01 NOTE — Patient Instructions (Signed)
*  Handouts given on polyps, hemorrhoids and high fiber diet.  YOU HAD AN ENDOSCOPIC PROCEDURE TODAY AT [facility]:   Refer to the procedure report that was given to you for any specific questions about what was found during the examination.  If the procedure report does not answer your questions, please call your gastroenterologist to clarify.  If you requested that your care partner not be given the details of your procedure findings, then the procedure report has been included in a sealed envelope for you to review at your convenience later.  YOU SHOULD EXPECT: Some feelings of bloating in the abdomen. Passage of more gas than usual.  Walking can help get rid of the air that was put into your GI tract during the procedure and reduce the bloating. If you had a lower endoscopy (such as a colonoscopy or flexible sigmoidoscopy) you may notice spotting of blood in your stool or on the toilet paper. If you underwent a bowel prep for your procedure, you may not have a normal bowel movement for a few days.  Please Note:  You might notice some irritation and congestion in your nose or some drainage.  This is from the oxygen used during your procedure.  There is no need for concern and it should clear up in a day or so.  SYMPTOMS TO REPORT IMMEDIATELY:   Following lower endoscopy (colonoscopy or flexible sigmoidoscopy):  Excessive amounts of blood in the stool  Significant tenderness or worsening of abdominal pains  Swelling of the abdomen that is new, acute  Fever of 100F or higher   For urgent or emergent issues, a gastroenterologist can be reached at any hour by calling [phone].   DIET:  We do recommend a small meal at first, but then you may proceed to your regular diet.  Drink plenty of fluids but you should avoid alcoholic beverages for 24 hours.  ACTIVITY:  You should plan to take it easy for the rest of today and you should NOT DRIVE or use heavy machinery until  tomorrow (because of the sedation medicines used during the test).    FOLLOW UP: Our staff will call the number listed on your records the next business day following your procedure to check on you and address any questions or concerns that you may have regarding the information given to you following your procedure. If we do not reach you, we will leave a message.  However, if you are feeling well and you are not experiencing any problems, there is no need to return our call.  We will assume that you have returned to your regular daily activities without incident.  If any biopsies were taken you will be contacted by phone or by letter within the next 1-3 weeks.  Please call us at [phone] if you have not heard about the biopsies in 3 weeks.    SIGNATURES/CONFIDENTIALITY: You and/or your care partner have signed paperwork which will be entered into your electronic medical record.  These signatures attest to the fact that that the information above on your After Visit Summary has been reviewed and is understood.  Full responsibility of the confidentiality of this discharge information lies with you and/or your care-partner.

## 2018-03-02 ENCOUNTER — Telehealth: Payer: Self-pay | Admitting: *Deleted

## 2018-03-02 ENCOUNTER — Telehealth: Payer: Self-pay

## 2018-03-02 NOTE — Telephone Encounter (Signed)
No answer, left message to call if questions or concerns. 

## 2018-03-02 NOTE — Telephone Encounter (Signed)
Attempted to reach pt. With follow-up call following endoscopic procedure 03/01/2018.  LM on pt. Ans. Machine to call if she has any questions or concerns.

## 2018-03-10 ENCOUNTER — Encounter: Payer: Self-pay | Admitting: Gastroenterology

## 2018-03-23 ENCOUNTER — Other Ambulatory Visit: Payer: Self-pay | Admitting: Family Medicine

## 2018-03-23 ENCOUNTER — Ambulatory Visit: Payer: Self-pay | Admitting: *Deleted

## 2018-03-23 ENCOUNTER — Encounter: Payer: Self-pay | Admitting: Family Medicine

## 2018-03-23 ENCOUNTER — Ambulatory Visit (INDEPENDENT_AMBULATORY_CARE_PROVIDER_SITE_OTHER): Payer: 59 | Admitting: Family Medicine

## 2018-03-23 VITALS — BP 120/80 | HR 62 | Temp 97.7°F | Ht 70.0 in | Wt 192.1 lb

## 2018-03-23 DIAGNOSIS — R0781 Pleurodynia: Secondary | ICD-10-CM

## 2018-03-23 DIAGNOSIS — K219 Gastro-esophageal reflux disease without esophagitis: Secondary | ICD-10-CM | POA: Diagnosis not present

## 2018-03-23 MED ORDER — ESOMEPRAZOLE MAGNESIUM 20 MG PO CPDR: 20.0000 mg | DELAYED_RELEASE_CAPSULE | Freq: Every day | ORAL | 1 refills | Status: DC
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
## 2018-03-23 NOTE — Patient Instructions (Addendum)
Try the nexium once daily  Heat and as needed tylenol for the pain in the side  Follow up in 1 month or as scheduled. Sooner if needed, worsening or new concerns.   Food Choices for Gastroesophageal Reflux Disease, Adult When you have gastroesophageal reflux disease (GERD), the foods you eat and your eating habits are very important. Choosing the right foods can help ease your discomfort. What guidelines do I need to follow?  Choose fruits, vegetables, whole grains, and low-fat dairy products.  Choose low-fat meat, fish, and poultry.  Limit fats such as oils, salad dressings, butter, nuts, and avocado.  Keep a food diary. This helps you identify foods that cause symptoms.  Avoid foods that cause symptoms. These may be different for everyone.  Eat small meals often instead of 3 large meals a day.  Eat your meals slowly, in a place where you are relaxed.  Limit fried foods.  Cook foods using methods other than frying.  Avoid drinking alcohol.  Avoid drinking large amounts of liquids with your meals.  Avoid bending over or lying down until 2-3 hours after eating. What foods are not recommended? These are some foods and drinks that may make your symptoms worse: Vegetables Tomatoes. Tomato juice. Tomato and spaghetti sauce. Chili peppers. Onion and garlic. Horseradish. Fruits Oranges, grapefruit, and lemon (fruit and juice). Meats High-fat meats, fish, and poultry. This includes hot dogs, ribs, ham, sausage, salami, and bacon. Dairy Whole milk and chocolate milk. Sour cream. Cream. Butter. Ice cream. Cream cheese. Drinks Coffee and tea. Bubbly (carbonated) drinks or energy drinks. Condiments Hot sauce. Barbecue sauce. Sweets/Desserts Chocolate and cocoa. Donuts. Peppermint and spearmint. Fats and Oils High-fat foods. This includes [place] fries and potato chips. Other Vinegar. Strong spices. This includes black pepper, white pepper, red pepper, cayenne, curry powder,  cloves, ginger, and chili powder. The items listed above may not be a complete list of foods and drinks to avoid. Contact your dietitian for more information. This information is not intended to replace advice given to you by your health care provider. Make sure you discuss any questions you have with your health care provider. Document Released: 04/25/2012 Document Revised: 04/01/2016 Document Reviewed: 08/29/2013 Elsevier Interactive Patient Education  2017 [employer].

## 2018-03-24 MED ORDER — ESOMEPRAZOLE MAGNESIUM 20 MG PO CPDR: 20.0000 mg | DELAYED_RELEASE_CAPSULE | Freq: Every day | ORAL | 0 refills | Status: DC

## 2018-03-24 NOTE — Telephone Encounter (Signed)
Rx done. 

## 2018-03-24 NOTE — Addendum Note (Signed)
Addended by: Agnes Lawrence on: 03/24/2018 07:49 AM   Modules accepted: Orders
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
## 2018-05-16 ENCOUNTER — Ambulatory Visit (INDEPENDENT_AMBULATORY_CARE_PROVIDER_SITE_OTHER): Payer: 59 | Admitting: Family Medicine

## 2018-05-16 ENCOUNTER — Encounter: Payer: Self-pay | Admitting: Family Medicine

## 2018-05-16 VITALS — BP 118/62 | HR 61 | Temp 98.0°F | Ht 70.0 in | Wt 193.1 lb

## 2018-05-16 DIAGNOSIS — Z6827 Body mass index (BMI) 27.0-27.9, adult: Secondary | ICD-10-CM

## 2018-05-16 DIAGNOSIS — L989 Disorder of the skin and subcutaneous tissue, unspecified: Secondary | ICD-10-CM | POA: Diagnosis not present

## 2018-05-16 DIAGNOSIS — K219 Gastro-esophageal reflux disease without esophagitis: Secondary | ICD-10-CM | POA: Diagnosis not present

## 2018-05-16 DIAGNOSIS — E785 Hyperlipidemia, unspecified: Secondary | ICD-10-CM | POA: Diagnosis not present

## 2018-05-16 NOTE — Patient Instructions (Signed)
BEFORE YOU LEAVE: -follow up: 3-6 months for physical with labs  Eat a healthy low sugar diet. Shoot for a 10 lb weight reduction over the next 3-6 months.  Get regular exercise.  Try to cut back on caffeine if possible.  Nexium or zantac as needed for acid reflux.  Antifungal twice daily and see your dermatologist for the skin lesion - call today for appointment.

## 2018-07-07 ENCOUNTER — Telehealth: Payer: Self-pay | Admitting: Family Medicine

## 2018-07-07 MED ORDER — SCOPOLAMINE 1 MG/3DAYS TD PT72: MEDICATED_PATCH | TRANSDERMAL | 0 refills | Status: DC

## 2018-07-07 NOTE — Telephone Encounter (Signed)
I called the pt, informed her of the message below and she denies any history of glaucoma.  She is aware the Rx was sent to her pharmacy.

## 2018-07-07 NOTE — Telephone Encounter (Signed)
Copied from [place] [phone]. Topic: Quick Communication - See Telephone Encounter >> Jul 07, 2018  9:40 AM Conception Chancy, NT wrote: CRM for notification. See Telephone encounter for: 07/07/18.  Patient is calling and would like to get a sea sickness patch. She is going to [place] and will be doing boating trips. Please advise.  [id number] - [place], [place] - [address] [place] [phone] Phone: [phone] Fax: [phone]

## 2018-07-07 NOTE — Telephone Encounter (Signed)
[place]. Does she have any hx of glaucoma? If so, should not use. If not, ok to send. Advise she talk to pharmacist about side effects, potential risks. Ok to send one patch per 3 days of boating. No refills. Not more then #5. Apply one patch q3days. Start 4 hours prior to boating.

## 2018-09-25 ENCOUNTER — Encounter: Payer: 59 | Admitting: Family Medicine

## 2018-10-19 ENCOUNTER — Other Ambulatory Visit: Payer: Self-pay | Admitting: *Deleted

## 2018-10-19 MED ORDER — ROSUVASTATIN CALCIUM 20 MG PO TABS: 20.0000 mg | ORAL_TABLET | Freq: Every day | ORAL | 0 refills | Status: DC

## 2018-10-19 NOTE — Telephone Encounter (Signed)
Rx done. 
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
## 2018-10-24 ENCOUNTER — Ambulatory Visit (INDEPENDENT_AMBULATORY_CARE_PROVIDER_SITE_OTHER): Payer: 59 | Admitting: Family Medicine

## 2018-10-24 ENCOUNTER — Encounter: Payer: Self-pay | Admitting: Family Medicine

## 2018-10-24 VITALS — BP 116/60 | HR 64 | Temp 97.6°F | Ht 70.0 in | Wt 194.9 lb

## 2018-10-24 DIAGNOSIS — Z0000 Encounter for general adult medical examination without abnormal findings: Secondary | ICD-10-CM | POA: Diagnosis not present

## 2018-10-24 DIAGNOSIS — R635 Abnormal weight gain: Secondary | ICD-10-CM

## 2018-10-24 DIAGNOSIS — R946 Abnormal results of thyroid function studies: Secondary | ICD-10-CM | POA: Diagnosis not present

## 2018-10-24 DIAGNOSIS — E785 Hyperlipidemia, unspecified: Secondary | ICD-10-CM

## 2018-10-24 NOTE — Patient Instructions (Signed)
BEFORE YOU LEAVE: -update flu vaccine in epic -schedule fasting lab visit -follow up: 6 months  We have ordered labs or studies at this visit. It can take up to 1-2 weeks for results and processing. IF results require follow up or explanation, we will call you with instructions. Clinically stable results will be released to your [facility]. If you have not heard from [place] or cannot find your results in [facility] in 2 weeks please contact our office at [phone].  If you are not yet signed up for [facility], please consider signing up.   We recommend the following healthy lifestyle for LIFE: 1) Small portions. But, make sure to get regular (at least 3 per day), healthy meals and small healthy snacks if needed.  2) Eat a healthy clean diet.   TRY TO EAT: -at least 5-7 servings of low sugar, colorful, and nutrient rich vegetables per day (not corn, potatoes or bananas.) -berries are the best choice if you wish to eat fruit (only eat small amounts if trying to reduce weight)  -lean meets (fish, white meat of chicken or [place]) -vegan proteins for some meals - beans or tofu, whole grains, nuts and seeds -Replace bad fats with good fats - good fats include: fish, nuts and seeds, canola oil, olive oil -small amounts of low fat or non fat dairy -small amounts of100 % whole grains - check the lables -drink plenty of water  AVOID: -SUGAR, sweets, anything with added sugar, corn syrup or sweeteners - must read labels as even foods advertised as "healthy" often are loaded with sugar -if you must have a sweetener, small amounts of stevia may be best -sweetened beverages and artificially sweetened beverages -simple starches (rice, bread, potatoes, pasta, chips, etc - small amounts of 100% whole grains are ok) -red meat, pork, butter -fried foods, fast food, processed food, excessive dairy, eggs and coconut.  3)Get at least 150 minutes of sweaty aerobic exercise per week.  4)Reduce stress - consider  counseling, meditation and relaxation to balance other aspects of your life.   Preventive Care 40-64 Years, Female Preventive care refers to lifestyle choices and visits with your health care provider that can promote health and wellness. What does preventive care include?  A yearly physical exam. This is also called an annual well check.  Dental exams once or twice a year.  Routine eye exams. Ask your health care provider how often you should have your eyes checked.  Personal lifestyle choices, including: ? Daily care of your teeth and gums. ? Regular physical activity. ? Eating a healthy diet. ? Avoiding tobacco and drug use. ? Limiting alcohol use. ? Practicing safe sex. ? Taking vitamin and mineral supplements as recommended by your health care provider. What happens during an annual well check? The services and screenings done by your health care provider during your annual well check will depend on your age, overall health, lifestyle risk factors, and family history of disease. Counseling Your health care provider may ask you questions about your:  Alcohol use.  Tobacco use.  Drug use.  Emotional well-being.  Home and relationship well-being.  Sexual activity.  Eating habits.  Work and work Statistician.  Method of birth control.  Menstrual cycle.  Pregnancy history.  Screening You may have the following tests or measurements:  Height, weight, and BMI.  Blood pressure.  Lipid and cholesterol levels. These may be checked every 5 years, or more frequently if you are over 49 years old.  Skin check.  Lung cancer screening. You may have this screening every year starting at age 24 if you have a 30-pack-year history of smoking and currently smoke or have quit within the past 15 years.  Fecal occult blood test (FOBT) of the stool. You may have this test every year starting at age 68.  Flexible sigmoidoscopy or colonoscopy. You may have a sigmoidoscopy  every 5 years or a colonoscopy every 10 years starting at age 87.  Hepatitis C blood test.  Hepatitis B blood test.  Sexually transmitted disease (STD) testing.  Diabetes screening. This is done by checking your blood sugar (glucose) after you have not eaten for a while (fasting). You may have this done every 1-3 years.  Mammogram. This may be done every 1-2 years. Talk to your health care provider about when you should start having regular mammograms. This may depend on whether you have a family history of breast cancer.  BRCA-related cancer screening. This may be done if you have a family history of breast, ovarian, tubal, or peritoneal cancers.  Pelvic exam and Pap test. This may be done every 3 years starting at age 36. Starting at age 40, this may be done every 5 years if you have a Pap test in combination with an HPV test.  Bone density scan. This is done to screen for osteoporosis. You may have this scan if you are at high risk for osteoporosis.  Discuss your test results, treatment options, and if necessary, the need for more tests with your health care provider. Vaccines Your health care provider may recommend certain vaccines, such as:  Influenza vaccine. This is recommended every year.  Tetanus, diphtheria, and acellular pertussis (Tdap, Td) vaccine. You may need a Td booster every 10 years.  Varicella vaccine. You may need this if you have not been vaccinated.  Zoster vaccine. You may need this after age 28.  Measles, mumps, and rubella (MMR) vaccine. You may need at least one dose of MMR if you were born in 1957 or later. You may also need a second dose.  Pneumococcal 13-valent conjugate (PCV13) vaccine. You may need this if you have certain conditions and were not previously vaccinated.  Pneumococcal polysaccharide (PPSV23) vaccine. You may need one or two doses if you smoke cigarettes or if you have certain conditions.  Meningococcal vaccine. You may need this if  you have certain conditions.  Hepatitis A vaccine. You may need this if you have certain conditions or if you travel or work in places where you may be exposed to hepatitis A.  Hepatitis B vaccine. You may need this if you have certain conditions or if you travel or work in places where you may be exposed to hepatitis B.  Haemophilus influenzae type b (Hib) vaccine. You may need this if you have certain conditions.  Talk to your health care provider about which screenings and vaccines you need and how often you need them. This information is not intended to replace advice given to you by your health care provider. Make sure you discuss any questions you have with your health care provider. Document Released: 11/21/2015 Document Revised: 07/14/2016 Document Reviewed: 08/26/2015 Elsevier Interactive Patient Education  [employer].

## 2018-10-25 LAB — LIPID PANEL
Cholesterol: 145 mg/dL (ref 0–200)
HDL: 59.4 mg/dL (ref >39.00)
LDL Cholesterol: 61 mg/dL (ref 0–99)
NonHDL: 85.8
Total CHOL/HDL Ratio: 2
Triglycerides: 126 mg/dL (ref 0.0–149.0)
VLDL: 25.2 mg/dL (ref 0.0–40.0)

## 2018-10-25 LAB — HEMOGLOBIN A1C: HEMOGLOBIN A1C: 5.6 % (ref 4.6–6.5)

## 2018-10-25 LAB — TSH: TSH: 5.25 u[IU]/mL — ABNORMAL HIGH (ref 0.35–4.50)

## 2018-10-25 NOTE — Addendum Note (Signed)
Addended by: Elmer Picker on: 10/25/2018 09:24 AM   Modules accepted: Orders

## 2018-10-26 NOTE — Addendum Note (Signed)
Addended by: Agnes Lawrence on: 10/26/2018 03:21 PM   Modules accepted: Orders

## 2018-11-15 ENCOUNTER — Other Ambulatory Visit (INDEPENDENT_AMBULATORY_CARE_PROVIDER_SITE_OTHER): Payer: 59

## 2018-11-15 DIAGNOSIS — R946 Abnormal results of thyroid function studies: Secondary | ICD-10-CM | POA: Diagnosis not present

## 2018-11-15 LAB — TSH: TSH: 4.11 u[IU]/mL (ref 0.35–4.50)

## 2018-11-15 LAB — T4, FREE: Free T4: 0.75 ng/dL (ref 0.60–1.60)

## 2018-11-16 LAB — T3: T3, Total: 111 ng/dL (ref 76–181)

## 2018-12-26 ENCOUNTER — Telehealth: Payer: Self-pay | Admitting: *Deleted

## 2018-12-26 DIAGNOSIS — R946 Abnormal results of thyroid function studies: Secondary | ICD-10-CM

## 2018-12-26 NOTE — Telephone Encounter (Signed)
Copied from [place] [phone]. Topic: Appointment Scheduling - Scheduling Inquiry for Clinic >> Dec 26, 2018  9:27 AM Bea Graff, NT wrote: Reason for CRM: Pt calling to request an appt with the lab for follow up labs for her thyroid. Once orders are placed please call pt to schedule.

## 2018-12-26 NOTE — Telephone Encounter (Signed)
I called the pt, entered the lab order and scheduled an appt for 02/19/19.

## 2019-01-19 ENCOUNTER — Other Ambulatory Visit: Payer: Self-pay | Admitting: Family Medicine

## 2019-02-19 ENCOUNTER — Other Ambulatory Visit: Payer: 59

## 2019-04-06 ENCOUNTER — Other Ambulatory Visit: Payer: Self-pay

## 2019-04-06 ENCOUNTER — Other Ambulatory Visit (INDEPENDENT_AMBULATORY_CARE_PROVIDER_SITE_OTHER): Payer: 59

## 2019-04-06 DIAGNOSIS — R946 Abnormal results of thyroid function studies: Secondary | ICD-10-CM

## 2019-04-06 LAB — TSH: TSH: 3.28 u[IU]/mL (ref 0.35–4.50)

## 2019-04-26 ENCOUNTER — Ambulatory Visit: Payer: 59 | Admitting: Family Medicine

## 2019-06-20 ENCOUNTER — Encounter: Payer: 59 | Admitting: Family Medicine

## 2019-07-21 ENCOUNTER — Other Ambulatory Visit: Payer: Self-pay | Admitting: Family Medicine

## 2019-07-25 ENCOUNTER — Ambulatory Visit (INDEPENDENT_AMBULATORY_CARE_PROVIDER_SITE_OTHER): Payer: 59 | Admitting: Family Medicine

## 2019-07-25 ENCOUNTER — Encounter: Payer: Self-pay | Admitting: Family Medicine

## 2019-07-25 ENCOUNTER — Other Ambulatory Visit: Payer: Self-pay

## 2019-07-25 DIAGNOSIS — E785 Hyperlipidemia, unspecified: Secondary | ICD-10-CM | POA: Diagnosis not present

## 2019-07-25 DIAGNOSIS — Z85828 Personal history of other malignant neoplasm of skin: Secondary | ICD-10-CM | POA: Diagnosis not present
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
## 2019-07-26 ENCOUNTER — Telehealth: Payer: Self-pay | Admitting: *Deleted

## 2019-07-26 NOTE — Telephone Encounter (Signed)
-----   Message from Caren Macadam, MD sent at 07/25/2019  2:46 PM EDT ----- Please schedule flu shot. Then labs in December followed by physical.

## 2019-07-26 NOTE — Telephone Encounter (Signed)
Appts scheduled as below.  

## 2019-08-12 ENCOUNTER — Other Ambulatory Visit: Payer: Self-pay | Admitting: Family Medicine

## 2019-09-04 ENCOUNTER — Ambulatory Visit: Payer: 59

## 2019-09-26 ENCOUNTER — Other Ambulatory Visit: Payer: 59

## 2019-10-15 ENCOUNTER — Ambulatory Visit (INDEPENDENT_AMBULATORY_CARE_PROVIDER_SITE_OTHER): Payer: 59 | Admitting: Family Medicine

## 2019-10-15 ENCOUNTER — Other Ambulatory Visit: Payer: Self-pay

## 2019-10-15 ENCOUNTER — Encounter: Payer: Self-pay | Admitting: Family Medicine

## 2019-10-15 VITALS — BP 102/58 | HR 56 | Temp 95.3°F | Ht 69.0 in | Wt 185.8 lb

## 2019-10-15 DIAGNOSIS — K219 Gastro-esophageal reflux disease without esophagitis: Secondary | ICD-10-CM

## 2019-10-15 DIAGNOSIS — Z0000 Encounter for general adult medical examination without abnormal findings: Secondary | ICD-10-CM

## 2019-10-15 DIAGNOSIS — L309 Dermatitis, unspecified: Secondary | ICD-10-CM

## 2019-10-15 DIAGNOSIS — E785 Hyperlipidemia, unspecified: Secondary | ICD-10-CM | POA: Diagnosis not present
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
## 2019-10-16 ENCOUNTER — Other Ambulatory Visit: Payer: Self-pay

## 2019-10-17 ENCOUNTER — Other Ambulatory Visit (INDEPENDENT_AMBULATORY_CARE_PROVIDER_SITE_OTHER): Payer: 59

## 2019-10-17 DIAGNOSIS — Z85828 Personal history of other malignant neoplasm of skin: Secondary | ICD-10-CM | POA: Diagnosis not present

## 2019-10-17 DIAGNOSIS — E785 Hyperlipidemia, unspecified: Secondary | ICD-10-CM

## 2019-10-17 DIAGNOSIS — R946 Abnormal results of thyroid function studies: Secondary | ICD-10-CM

## 2019-10-17 LAB — COMPREHENSIVE METABOLIC PANEL
ALT: 17 U/L (ref 0–35)
AST: 16 U/L (ref 0–37)
Albumin: 4.1 g/dL (ref 3.5–5.2)
Alkaline Phosphatase: 53 U/L (ref 39–117)
BUN: 17 mg/dL (ref 6–23)
CO2: 29 mEq/L (ref 19–32)
Calcium: 9 mg/dL (ref 8.4–10.5)
Chloride: 103 mEq/L (ref 96–112)
Creatinine, Ser: 0.69 mg/dL (ref 0.40–1.20)
GFR: 85.57 mL/min (ref >60.00)
Glucose, Bld: 87 mg/dL (ref 70–99)
Potassium: 4.7 mEq/L (ref 3.5–5.1)
Sodium: 137 mEq/L (ref 135–145)
Total Bilirubin: 0.4 mg/dL (ref 0.2–1.2)
Total Protein: 6.4 g/dL (ref 6.0–8.3)

## 2019-10-17 LAB — CBC WITH DIFFERENTIAL/PLATELET
Basophils Absolute: 0 10*3/uL (ref 0.0–0.1)
Basophils Relative: 0.9 % (ref 0.0–3.0)
Eosinophils Absolute: 0 10*3/uL (ref 0.0–0.7)
Eosinophils Relative: 1 % (ref 0.0–5.0)
HCT: 39.9 % (ref 36.0–46.0)
Hemoglobin: 13.3 g/dL (ref 12.0–15.0)
Lymphocytes Relative: 36.4 % (ref 12.0–46.0)
Lymphs Abs: 1.6 10*3/uL (ref 0.7–4.0)
MCHC: 33.5 g/dL (ref 30.0–36.0)
MCV: 94.5 fl (ref 78.0–100.0)
Monocytes Absolute: 0.4 10*3/uL (ref 0.1–1.0)
Monocytes Relative: 9.4 % (ref 3.0–12.0)
Neutro Abs: 2.3 10*3/uL (ref 1.4–7.7)
Neutrophils Relative %: 52.3 % (ref 43.0–77.0)
Platelets: 204 10*3/uL (ref 150.0–400.0)
RBC: 4.22 Mil/uL (ref 3.87–5.11)
RDW: 12.8 % (ref 11.5–15.5)
WBC: 4.3 10*3/uL (ref 4.0–10.5)

## 2019-10-17 LAB — LIPID PANEL
Cholesterol: 161 mg/dL (ref 0–200)
HDL: 68.3 mg/dL (ref >39.00)
LDL Cholesterol: 61 mg/dL (ref 0–99)
NonHDL: 92.74
Total CHOL/HDL Ratio: 2
Triglycerides: 158 mg/dL — ABNORMAL HIGH (ref 0.0–149.0)
VLDL: 31.6 mg/dL (ref 0.0–40.0)

## 2019-10-17 LAB — TSH: TSH: 5.59 u[IU]/mL — ABNORMAL HIGH (ref 0.35–4.50)

## 2020-01-08 ENCOUNTER — Emergency Department (HOSPITAL_COMMUNITY)
Admission: EM | Admit: 2020-01-08 | Discharge: 2020-01-08 | Disposition: A | Discharge status: Home / Self Care | Payer: 59 | Attending: Emergency Medicine | Admitting: Emergency Medicine

## 2020-01-08 ENCOUNTER — Other Ambulatory Visit: Payer: Self-pay

## 2020-01-08 ENCOUNTER — Emergency Department (HOSPITAL_COMMUNITY): Payer: 59

## 2020-01-08 ENCOUNTER — Encounter (HOSPITAL_COMMUNITY): Payer: Self-pay | Admitting: Emergency Medicine

## 2020-01-08 DIAGNOSIS — Z79899 Other long term (current) drug therapy: Secondary | ICD-10-CM | POA: Diagnosis not present

## 2020-01-08 DIAGNOSIS — R002 Palpitations: Secondary | ICD-10-CM | POA: Insufficient documentation

## 2020-01-08 DIAGNOSIS — R079 Chest pain, unspecified: Secondary | ICD-10-CM | POA: Diagnosis present

## 2020-01-08 LAB — BASIC METABOLIC PANEL
Anion gap: 8 (ref 5–15)
BUN: 11 mg/dL (ref 8–23)
CO2: 26 mmol/L (ref 22–32)
Calcium: 9.2 mg/dL (ref 8.9–10.3)
Chloride: 105 mmol/L (ref 98–111)
Creatinine, Ser: 0.62 mg/dL (ref 0.44–1.00)
GFR calc Af Amer: >60 mL/min (ref >60)
GFR calc non Af Amer: >60 mL/min (ref >60)
Glucose, Bld: 99 mg/dL (ref 70–99)
Potassium: 4.1 mmol/L (ref 3.5–5.1)
Sodium: 139 mmol/L (ref 135–145)

## 2020-01-08 LAB — CBC
HCT: 41.4 % (ref 36.0–46.0)
Hemoglobin: 13.2 g/dL (ref 12.0–15.0)
MCH: 30.8 pg (ref 26.0–34.0)
MCHC: 31.9 g/dL (ref 30.0–36.0)
MCV: 96.5 fL (ref 80.0–100.0)
Platelets: 210 10*3/uL (ref 150–400)
RBC: 4.29 MIL/uL (ref 3.87–5.11)
RDW: 11.9 % (ref 11.5–15.5)
WBC: 6.5 10*3/uL (ref 4.0–10.5)
nRBC: 0 % (ref 0.0–0.2)

## 2020-01-08 LAB — TROPONIN I (HIGH SENSITIVITY)
Troponin I (High Sensitivity): 4 ng/L (ref <18)
Troponin I (High Sensitivity): 4 ng/L (ref <18)

## 2020-01-08 MED ORDER — SODIUM CHLORIDE 0.9% FLUSH: 3.0000 mL | Freq: Once | INTRAVENOUS | Status: DC

## 2020-01-08 NOTE — ED Triage Notes (Signed)
States felt a fluttering in her chest  Yesterday had a lot of belching . States has taken a tums and it has not helped called her dr and he sent her forfurther tests, states has felt dizzy  That has come and gone yesterday
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
## 2020-01-14 ENCOUNTER — Ambulatory Visit: Payer: 59 | Attending: Internal Medicine

## 2020-01-14 DIAGNOSIS — Z20822 Contact with and (suspected) exposure to covid-19: Secondary | ICD-10-CM

## 2020-01-15 LAB — NOVEL CORONAVIRUS, NAA: SARS-CoV-2, NAA: NOT DETECTED

## 2020-01-22 ENCOUNTER — Other Ambulatory Visit: Payer: Self-pay

## 2020-01-23 ENCOUNTER — Other Ambulatory Visit (INDEPENDENT_AMBULATORY_CARE_PROVIDER_SITE_OTHER): Payer: 59

## 2020-01-23 DIAGNOSIS — R946 Abnormal results of thyroid function studies: Secondary | ICD-10-CM | POA: Diagnosis not present

## 2020-01-23 LAB — T3, FREE: T3, Free: 3.5 pg/mL (ref 2.3–4.2)

## 2020-01-23 LAB — T4, FREE: Free T4: 0.63 ng/dL (ref 0.60–1.60)

## 2020-01-23 LAB — TSH: TSH: 4.17 u[IU]/mL (ref 0.35–4.50)

## 2020-07-21 ENCOUNTER — Other Ambulatory Visit: Payer: Self-pay | Admitting: Family Medicine

## 2020-07-21 ENCOUNTER — Other Ambulatory Visit: Payer: 59

## 2020-07-21 ENCOUNTER — Other Ambulatory Visit: Payer: Self-pay

## 2020-07-21 DIAGNOSIS — Z20822 Contact with and (suspected) exposure to covid-19: Secondary | ICD-10-CM

## 2020-07-23 LAB — NOVEL CORONAVIRUS, NAA: SARS-CoV-2, NAA: NOT DETECTED

## 2020-07-23 LAB — SARS-COV-2, NAA 2 DAY TAT

## 2020-07-27 ENCOUNTER — Other Ambulatory Visit: Payer: Self-pay | Admitting: Family Medicine

## 2020-10-15 ENCOUNTER — Encounter: Payer: 59 | Admitting: Family Medicine

## 2020-12-04 ENCOUNTER — Other Ambulatory Visit: Payer: Self-pay

## 2020-12-05 ENCOUNTER — Encounter: Payer: Self-pay | Admitting: Family Medicine

## 2020-12-05 ENCOUNTER — Ambulatory Visit (INDEPENDENT_AMBULATORY_CARE_PROVIDER_SITE_OTHER): Payer: Medicare HMO | Admitting: Family Medicine

## 2020-12-05 VITALS — BP 120/68 | HR 57 | Temp 97.9°F | Ht 69.5 in | Wt 187.4 lb

## 2020-12-05 DIAGNOSIS — E559 Vitamin D deficiency, unspecified: Secondary | ICD-10-CM

## 2020-12-05 DIAGNOSIS — Z23 Encounter for immunization: Secondary | ICD-10-CM | POA: Diagnosis not present

## 2020-12-05 DIAGNOSIS — Z0000 Encounter for general adult medical examination without abnormal findings: Secondary | ICD-10-CM

## 2020-12-05 DIAGNOSIS — E538 Deficiency of other specified B group vitamins: Secondary | ICD-10-CM | POA: Diagnosis not present

## 2020-12-05 DIAGNOSIS — E785 Hyperlipidemia, unspecified: Secondary | ICD-10-CM | POA: Diagnosis not present

## 2020-12-05 LAB — COMPREHENSIVE METABOLIC PANEL
ALT: 24 U/L (ref 0–35)
AST: 24 U/L (ref 0–37)
Albumin: 4.1 g/dL (ref 3.5–5.2)
Alkaline Phosphatase: 46 U/L (ref 39–117)
BUN: 16 mg/dL (ref 6–23)
CO2: 25 mEq/L (ref 19–32)
Calcium: 9.2 mg/dL (ref 8.4–10.5)
Chloride: 106 mEq/L (ref 96–112)
Creatinine, Ser: 0.68 mg/dL (ref 0.40–1.20)
GFR: 91.38 mL/min (ref >60.00)
Glucose, Bld: 83 mg/dL (ref 70–99)
Potassium: 4.5 mEq/L (ref 3.5–5.1)
Sodium: 139 mEq/L (ref 135–145)
Total Bilirubin: 0.4 mg/dL (ref 0.2–1.2)
Total Protein: 6.7 g/dL (ref 6.0–8.3)

## 2020-12-05 LAB — CBC WITH DIFFERENTIAL/PLATELET
Basophils Absolute: 0.1 10*3/uL (ref 0.0–0.1)
Basophils Relative: 1.1 % (ref 0.0–3.0)
Eosinophils Absolute: 0.1 10*3/uL (ref 0.0–0.7)
Eosinophils Relative: 2.9 % (ref 0.0–5.0)
HCT: 40.7 % (ref 36.0–46.0)
Hemoglobin: 13.5 g/dL (ref 12.0–15.0)
Lymphocytes Relative: 30.6 % (ref 12.0–46.0)
Lymphs Abs: 1.5 10*3/uL (ref 0.7–4.0)
MCHC: 33.3 g/dL (ref 30.0–36.0)
MCV: 93.9 fl (ref 78.0–100.0)
Monocytes Absolute: 0.4 10*3/uL (ref 0.1–1.0)
Monocytes Relative: 8.6 % (ref 3.0–12.0)
Neutro Abs: 2.8 10*3/uL (ref 1.4–7.7)
Neutrophils Relative %: 56.8 % (ref 43.0–77.0)
Platelets: 178 10*3/uL (ref 150.0–400.0)
RBC: 4.34 Mil/uL (ref 3.87–5.11)
RDW: 12.4 % (ref 11.5–15.5)
WBC: 5 10*3/uL (ref 4.0–10.5)

## 2020-12-05 LAB — LIPID PANEL
Cholesterol: 144 mg/dL (ref 0–200)
HDL: 58.8 mg/dL (ref >39.00)
LDL Cholesterol: 65 mg/dL (ref 0–99)
NonHDL: 85.22
Total CHOL/HDL Ratio: 2
Triglycerides: 103 mg/dL (ref 0.0–149.0)
VLDL: 20.6 mg/dL (ref 0.0–40.0)

## 2020-12-05 LAB — VITAMIN B12: Vitamin B-12: 147 pg/mL — ABNORMAL LOW (ref 211–911)

## 2020-12-05 LAB — VITAMIN D 25 HYDROXY (VIT D DEFICIENCY, FRACTURES): VITD: 47.14 ng/mL (ref 30.00–100.00)
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
## 2021-01-19 ENCOUNTER — Other Ambulatory Visit: Payer: Self-pay | Admitting: Family Medicine

## 2021-04-29 ENCOUNTER — Telehealth (INDEPENDENT_AMBULATORY_CARE_PROVIDER_SITE_OTHER): Payer: Medicare HMO | Admitting: Family Medicine

## 2021-04-29 ENCOUNTER — Other Ambulatory Visit: Payer: Self-pay

## 2021-04-29 DIAGNOSIS — J0110 Acute frontal sinusitis, unspecified: Secondary | ICD-10-CM

## 2021-04-29 MED ORDER — AMOXICILLIN-POT CLAVULANATE 875-125 MG PO TABS: 1.0000 | ORAL_TABLET | Freq: Two times a day (BID) | ORAL | 0 refills | Status: DC
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
## 2021-06-12 ENCOUNTER — Ambulatory Visit: Payer: Medicare HMO

## 2021-06-12 ENCOUNTER — Telehealth: Payer: Self-pay | Admitting: Family Medicine

## 2021-06-12 NOTE — Telephone Encounter (Signed)
Left message asking pt to all my jabber number [phone] or office Mickel Baas will be out of office please r/s 06/12/21 AWV appt

## 2021-06-24 ENCOUNTER — Ambulatory Visit (INDEPENDENT_AMBULATORY_CARE_PROVIDER_SITE_OTHER): Payer: Medicare HMO

## 2021-06-24 DIAGNOSIS — Z0000 Encounter for general adult medical examination without abnormal findings: Secondary | ICD-10-CM | POA: Diagnosis not present

## 2021-06-24 DIAGNOSIS — Z780 Asymptomatic menopausal state: Secondary | ICD-10-CM | POA: Diagnosis not present
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
## 2021-06-24 NOTE — Patient Instructions (Signed)
Pamela Cameron , Thank you for taking time to come for your Medicare Wellness Visit. I appreciate your ongoing commitment to your health goals. Please review the following plan we discussed and let me know if I can assist you in the future.   Screening recommendations/referrals: Colonoscopy: 03/01/2018 due 2024 Mammogram: 11/08/2020 Bone Density: referral completed 06/24/2021 Recommended yearly ophthalmology/optometry visit for glaucoma screening and checkup Recommended yearly dental visit for hygiene and checkup  Vaccinations: Influenza vaccine: due in fall 2022  Pneumococcal vaccine: completed series  Tdap vaccine: 07/09/2014 due 2025 Shingles vaccine: completed series     Advanced directives: will provide copies   Conditions/risks identified: none   Next appointment: none    Preventive Care 66 Years and Older, Female Preventive care refers to lifestyle choices and visits with your health care provider that can promote health and wellness. What does preventive care include? A yearly physical exam. This is also called an annual well check. Dental exams once or twice a year. Routine eye exams. Ask your health care provider how often you should have your eyes checked. Personal lifestyle choices, including: Daily care of your teeth and gums. Regular physical activity. Eating a healthy diet. Avoiding tobacco and drug use. Limiting alcohol use. Practicing safe sex. Taking low-dose aspirin every day. Taking vitamin and mineral supplements as recommended by your health care provider. What happens during an annual well check? The services and screenings done by your health care provider during your annual well check will depend on your age, overall health, lifestyle risk factors, and family history of disease. Counseling  Your health care provider may ask you questions about your: Alcohol use. Tobacco use. Drug use. Emotional well-being. Home and relationship well-being. Sexual  activity. Eating habits. History of falls. Memory and ability to understand (cognition). Work and work Statistician. Reproductive health. Screening  You may have the following tests or measurements: Height, weight, and BMI. Blood pressure. Lipid and cholesterol levels. These may be checked every 5 years, or more frequently if you are over 66 years old. Skin check. Lung cancer screening. You may have this screening every year starting at age 66 if you have a 30-pack-year history of smoking and currently smoke or have quit within the past 15 years. Fecal occult blood test (FOBT) of the stool. You may have this test every year starting at age 66. Flexible sigmoidoscopy or colonoscopy. You may have a sigmoidoscopy every 5 years or a colonoscopy every 10 years starting at age 66. Hepatitis C blood test. Hepatitis B blood test. Sexually transmitted disease (STD) testing. Diabetes screening. This is done by checking your blood sugar (glucose) after you have not eaten for a while (fasting). You may have this done every 1-3 years. Bone density scan. This is done to screen for osteoporosis. You may have this done starting at age 66. Mammogram. This may be done every 1-2 years. Talk to your health care provider about how often you should have regular mammograms. Talk with your health care provider about your test results, treatment options, and if necessary, the need for more tests. Vaccines  Your health care provider may recommend certain vaccines, such as: Influenza vaccine. This is recommended every year. Tetanus, diphtheria, and acellular pertussis (Tdap, Td) vaccine. You may need a Td booster every 10 years. Zoster vaccine. You may need this after age 66. Pneumococcal 13-valent conjugate (PCV13) vaccine. One dose is recommended after age 66. Pneumococcal polysaccharide (PPSV23) vaccine. One dose is recommended after age 66. Talk to your health  care provider about which screenings and vaccines  you need and how often you need them. This information is not intended to replace advice given to you by your health care provider. Make sure you discuss any questions you have with your health care provider. Document Released: 11/21/2015 Document Revised: 07/14/2016 Document Reviewed: 08/26/2015 Elsevier Interactive Patient Education  2017 [employer] Prevention in the Home Falls can cause injuries. They can happen to people of all ages. There are many things you can do to make your home safe and to help prevent falls. What can I do on the outside of my home? Regularly fix the edges of walkways and driveways and fix any cracks. Remove anything that might make you trip as you walk through a door, such as a raised step or threshold. Trim any bushes or trees on the path to your home. Use bright outdoor lighting. Clear any walking paths of anything that might make someone trip, such as rocks or tools. Regularly check to see if handrails are loose or broken. Make sure that both sides of any steps have handrails. Any raised decks and porches should have guardrails on the edges. Have any leaves, snow, or ice cleared regularly. Use sand or salt on walking paths during winter. Clean up any spills in your garage right away. This includes oil or grease spills. What can I do in the bathroom? Use night lights. Install grab bars by the toilet and in the tub and shower. Do not use towel bars as grab bars. Use non-skid mats or decals in the tub or shower. If you need to sit down in the shower, use a plastic, non-slip stool. Keep the floor dry. Clean up any water that spills on the floor as soon as it happens. Remove soap buildup in the tub or shower regularly. Attach bath mats securely with double-sided non-slip rug tape. Do not have throw rugs and other things on the floor that can make you trip. What can I do in the bedroom? Use night lights. Make sure that you have a light by your bed that  is easy to reach. Do not use any sheets or blankets that are too big for your bed. They should not hang down onto the floor. Have a firm chair that has side arms. You can use this for support while you get dressed. Do not have throw rugs and other things on the floor that can make you trip. What can I do in the kitchen? Clean up any spills right away. Avoid walking on wet floors. Keep items that you use a lot in easy-to-reach places. If you need to reach something above you, use a strong step stool that has a grab bar. Keep electrical cords out of the way. Do not use floor polish or wax that makes floors slippery. If you must use wax, use non-skid floor wax. Do not have throw rugs and other things on the floor that can make you trip. What can I do with my stairs? Do not leave any items on the stairs. Make sure that there are handrails on both sides of the stairs and use them. Fix handrails that are broken or loose. Make sure that handrails are as long as the stairways. Check any carpeting to make sure that it is firmly attached to the stairs. Fix any carpet that is loose or worn. Avoid having throw rugs at the top or bottom of the stairs. If you do have throw rugs, attach them to  the floor with carpet tape. Make sure that you have a light switch at the top of the stairs and the bottom of the stairs. If you do not have them, ask someone to add them for you. What else can I do to help prevent falls? Wear shoes that: Do not have high heels. Have rubber bottoms. Are comfortable and fit you well. Are closed at the toe. Do not wear sandals. If you use a stepladder: Make sure that it is fully opened. Do not climb a closed stepladder. Make sure that both sides of the stepladder are locked into place. Ask someone to hold it for you, if possible. Clearly mark and make sure that you can see: Any grab bars or handrails. First and last steps. Where the edge of each step is. Use tools that help you  move around (mobility aids) if they are needed. These include: Canes. Walkers. Scooters. Crutches. Turn on the lights when you go into a dark area. Replace any light bulbs as soon as they burn out. Set up your furniture so you have a clear path. Avoid moving your furniture around. If any of your floors are uneven, fix them. If there are any pets around you, be aware of where they are. Review your medicines with your doctor. Some medicines can make you feel dizzy. This can increase your chance of falling. Ask your doctor what other things that you can do to help prevent falls. This information is not intended to replace advice given to you by your health care provider. Make sure you discuss any questions you have with your health care provider. Document Released: 08/21/2009 Document Revised: 04/01/2016 Document Reviewed: 11/29/2014 Elsevier Interactive Patient Education  2017 [employer].

## 2021-10-14 ENCOUNTER — Telehealth: Payer: Self-pay | Admitting: Family Medicine

## 2021-10-14 ENCOUNTER — Telehealth (INDEPENDENT_AMBULATORY_CARE_PROVIDER_SITE_OTHER): Payer: Medicare HMO | Admitting: Registered Nurse

## 2021-10-14 VITALS — Temp 101.2°F

## 2021-10-14 DIAGNOSIS — R0981 Nasal congestion: Secondary | ICD-10-CM

## 2021-10-14 DIAGNOSIS — R6889 Other general symptoms and signs: Secondary | ICD-10-CM

## 2021-10-14 MED ORDER — DM-GUAIFENESIN ER 30-600 MG PO TB12: 1.0000 | ORAL_TABLET | Freq: Two times a day (BID) | ORAL | 0 refills | Status: DC

## 2021-10-14 MED ORDER — OSELTAMIVIR PHOSPHATE 75 MG PO CAPS: 75.0000 mg | ORAL_CAPSULE | Freq: Two times a day (BID) | ORAL | 0 refills | Status: DC

## 2021-10-14 MED ORDER — AZELASTINE HCL 0.1 % NA SOLN: 1.0000 | Freq: Two times a day (BID) | NASAL | 12 refills | Status: DC

## 2021-10-14 NOTE — Telephone Encounter (Signed)
Patient calling in with respiratory symptoms: Shortness of breath, chest pain, palpitations or other red words send to Triage  Does the patient have a fever over 100, cough, congestion, sore throat, runny nose, lost of taste/smell within the last 5 days (please list symptoms that patient has)?fever, headache, cough since yesterday morning  Have you tested for Covid in the last 5 days? No   If yes, was it positive OR negative ? If positive in the last 5 days, please schedule virtual visit now. If negative, schedule for an in person OV with the next available provider if PCP has no openings. Please also let patient know they will be tested again (follow the script below)  "you will have to arrive 13mins prior to your appt time to be Covid tested. Please park in back of office at the [facility] & call [phone] to let the staff know you have arrived. A staff member will meet you at your car to do a rapid covid test. Once the test has resulted you will be notified by phone of your results to determine if appt will remain an in person visit or be converted to a virtual/phone visit. If you arrive less than 16mins before your appt time, your visit will be automatically converted to virtual & any recommended testing will happen AFTER the visit."   [place]  If no availability for virtual visit in office,  please schedule another [place] office  If no availability at another [place] office, please instruct patient that they can schedule an evisit or virtual visit through their mychart account. Visits up to 8pm  patients can be seen in office 5 days after positive COVID test
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
## 2021-12-09 ENCOUNTER — Ambulatory Visit (INDEPENDENT_AMBULATORY_CARE_PROVIDER_SITE_OTHER): Payer: Medicare Other | Admitting: Family Medicine

## 2021-12-09 ENCOUNTER — Encounter: Payer: Self-pay | Admitting: Family Medicine

## 2021-12-09 DIAGNOSIS — E559 Vitamin D deficiency, unspecified: Secondary | ICD-10-CM | POA: Diagnosis not present

## 2021-12-09 DIAGNOSIS — E785 Hyperlipidemia, unspecified: Secondary | ICD-10-CM

## 2021-12-09 DIAGNOSIS — Z1231 Encounter for screening mammogram for malignant neoplasm of breast: Secondary | ICD-10-CM | POA: Diagnosis not present

## 2021-12-09 DIAGNOSIS — E2839 Other primary ovarian failure: Secondary | ICD-10-CM | POA: Diagnosis not present

## 2021-12-09 DIAGNOSIS — E538 Deficiency of other specified B group vitamins: Secondary | ICD-10-CM

## 2021-12-09 DIAGNOSIS — K5900 Constipation, unspecified: Secondary | ICD-10-CM

## 2021-12-09 DIAGNOSIS — Z0000 Encounter for general adult medical examination without abnormal findings: Secondary | ICD-10-CM | POA: Diagnosis not present

## 2021-12-09 LAB — LIPID PANEL
Cholesterol: 141 mg/dL (ref 0–200)
HDL: 56.4 mg/dL (ref >39.00)
LDL Cholesterol: 58 mg/dL (ref 0–99)
NonHDL: 84.45
Total CHOL/HDL Ratio: 2
Triglycerides: 131 mg/dL (ref 0.0–149.0)
VLDL: 26.2 mg/dL (ref 0.0–40.0)

## 2021-12-09 LAB — CBC WITH DIFFERENTIAL/PLATELET
Basophils Absolute: 0 10*3/uL (ref 0.0–0.1)
Basophils Relative: 0.8 % (ref 0.0–3.0)
Eosinophils Absolute: 0.1 10*3/uL (ref 0.0–0.7)
Eosinophils Relative: 1.5 % (ref 0.0–5.0)
HCT: 39.7 % (ref 36.0–46.0)
Hemoglobin: 13.1 g/dL (ref 12.0–15.0)
Lymphocytes Relative: 27.8 % (ref 12.0–46.0)
Lymphs Abs: 1.4 10*3/uL (ref 0.7–4.0)
MCHC: 33 g/dL (ref 30.0–36.0)
MCV: 93.4 fl (ref 78.0–100.0)
Monocytes Absolute: 0.4 10*3/uL (ref 0.1–1.0)
Monocytes Relative: 7.9 % (ref 3.0–12.0)
Neutro Abs: 3.1 10*3/uL (ref 1.4–7.7)
Neutrophils Relative %: 62 % (ref 43.0–77.0)
Platelets: 215 10*3/uL (ref 150.0–400.0)
RBC: 4.25 Mil/uL (ref 3.87–5.11)
RDW: 12.6 % (ref 11.5–15.5)
WBC: 5.1 10*3/uL (ref 4.0–10.5)

## 2021-12-09 LAB — COMPREHENSIVE METABOLIC PANEL
ALT: 19 U/L (ref 0–35)
AST: 16 U/L (ref 0–37)
Albumin: 4.1 g/dL (ref 3.5–5.2)
Alkaline Phosphatase: 42 U/L (ref 39–117)
BUN: 10 mg/dL (ref 6–23)
CO2: 30 mEq/L (ref 19–32)
Calcium: 9.1 mg/dL (ref 8.4–10.5)
Chloride: 104 mEq/L (ref 96–112)
Creatinine, Ser: 0.72 mg/dL (ref 0.40–1.20)
GFR: 87.11 mL/min (ref >60.00)
Glucose, Bld: 94 mg/dL (ref 70–99)
Potassium: 4.4 mEq/L (ref 3.5–5.1)
Sodium: 138 mEq/L (ref 135–145)
Total Bilirubin: 0.4 mg/dL (ref 0.2–1.2)
Total Protein: 6.6 g/dL (ref 6.0–8.3)

## 2021-12-09 LAB — VITAMIN B12: Vitamin B-12: 503 pg/mL (ref 211–911)

## 2021-12-09 LAB — FOLATE: Folate: 13.6 ng/mL (ref >5.9)

## 2021-12-09 LAB — VITAMIN D 25 HYDROXY (VIT D DEFICIENCY, FRACTURES): VITD: 68.42 ng/mL (ref 30.00–100.00)

## 2021-12-09 NOTE — Patient Instructions (Addendum)
Constipation: consider fiber supplement - citrucel, metamucil. If that is not working, then short term; could try some miralax.
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
## 2022-01-25 ENCOUNTER — Telehealth: Payer: Self-pay | Admitting: Family Medicine

## 2022-01-25 NOTE — Telephone Encounter (Signed)
Since that provider is leaving I would recommend for this patient to switch to another provider in that office.  ?

## 2022-01-25 NOTE — Telephone Encounter (Signed)
LVM for pt w/ Dr. Nathanial Millman recommendation ? ?

## 2022-01-25 NOTE — Telephone Encounter (Signed)
Pt requesting toc from Dr. Ethlyn Gallery to Dr. Sharlet Salina ? ?Please advise ?

## 2022-01-26 ENCOUNTER — Ambulatory Visit
Admission: RE | Admit: 2022-01-26 | Discharge: 2022-01-26 | Disposition: A | Discharge status: Home / Self Care | Payer: Medicare Other | Source: Ambulatory Visit | Attending: Family Medicine | Admitting: Family Medicine

## 2022-01-26 DIAGNOSIS — Z1231 Encounter for screening mammogram for malignant neoplasm of breast: Secondary | ICD-10-CM

## 2022-02-21 ENCOUNTER — Encounter: Payer: Self-pay | Admitting: Internal Medicine

## 2022-02-21 DIAGNOSIS — Z85828 Personal history of other malignant neoplasm of skin: Secondary | ICD-10-CM | POA: Insufficient documentation

## 2022-02-21 DIAGNOSIS — L719 Rosacea, unspecified: Secondary | ICD-10-CM | POA: Insufficient documentation

## 2022-02-21 NOTE — Progress Notes (Signed)
? ? ? ? ?Subjective:  ? ? Patient ID: Pamela Cameron, female    DOB: 12/24/1955, 66 y.o.   MRN: [id number] ? ?This visit occurred during the SARS-CoV-2 public health emergency.  Safety protocols were in place, including screening questions prior to the visit, additional usage of staff PPE, and extensive cleaning of exam room while observing appropriate contact time as indicated for disinfecting solutions.   ? ? ?HPI ?Pamela Cameron is here to establish with a new PCP and for follow up of her chronic medical problems, including hld, chronic low back pain.  ? ?Overall doing well, no concerns. ? ?Medications and allergies reviewed with patient and updated if appropriate. ? ?Current Outpatient Medications on File Prior to Visit  ?Medication Sig Dispense Refill  ? Cholecalciferol (VITAMIN D3 PO) Take 50 mcg by mouth daily.    ? Esomeprazole Magnesium (NEXIUM PO) Take by mouth daily.    ? estradiol (ESTRACE) 2 MG tablet Take 2 mg by mouth daily.    ? PRESCRIPTION MEDICATION Rx for rosacea-Azelaic acid    ? rosuvastatin (CRESTOR) 20 MG tablet TAKE 1 TABLET BY MOUTH EVERY DAY 90 tablet 3  ? TURMERIC PO Take 1 tablet by mouth daily.    ? ?No current facility-administered medications on file prior to visit.  ? ? ? ?Review of Systems  ?Constitutional:  Negative for fever.  ?HENT:  Positive for tinnitus.   ?Respiratory:  Negative for cough and shortness of breath.   ?Cardiovascular:  Negative for chest pain, palpitations and leg swelling.  ?Musculoskeletal:  Positive for arthralgias (mild) and back pain.  ?Neurological:  Negative for light-headedness and headaches.  ? ?   ?Objective:  ? ?Vitals:  ? 02/22/22 1056  ?BP: 106/78  ?Pulse: 67  ?Temp: 98 ?F (36.7 ?C)  ?SpO2: 98%  ? ?BP Readings from Last 3 Encounters:  ?02/22/22 106/78  ?12/05/20 120/68  ?01/08/20 129/70  ? ?Wt Readings from Last 3 Encounters:  ?02/22/22 182 lb 3.2 oz (82.6 kg)  ?12/05/20 187 lb 6.4 oz (85 kg)  ?10/15/19 185 lb 12.8 oz (84.3 kg)  ? ?Body mass index is 26.52  kg/m?. ? ?  ?Physical Exam ?Constitutional:   ?   General: She is not in acute distress. ?   Appearance: Normal appearance.  ?HENT:  ?   Head: Normocephalic and atraumatic.  ?Eyes:  ?   Conjunctiva/sclera: Conjunctivae normal.  ?Cardiovascular:  ?   Rate and Rhythm: Normal rate and regular rhythm.  ?   Heart sounds: Normal heart sounds. No murmur heard. ?Pulmonary:  ?   Effort: Pulmonary effort is normal. No respiratory distress.  ?   Breath sounds: Normal breath sounds. No wheezing.  ?Musculoskeletal:  ?   Cervical back: Neck supple.  ?   Right lower leg: No edema.  ?   Left lower leg: No edema.  ?Lymphadenopathy:  ?   Cervical: No cervical adenopathy.  ?Skin: ?   General: Skin is warm and dry.  ?   Findings: No rash.  ?Neurological:  ?   Mental Status: She is alert. Mental status is at baseline.  ?Psychiatric:     ?   Mood and Affect: Mood normal.     ?   Behavior: Behavior normal.  ? ?   ? ?Lab Results  ?Component Value Date  ? WBC 5.1 12/09/2021  ? HGB 13.1 12/09/2021  ? HCT 39.7 12/09/2021  ? PLT 215.0 12/09/2021  ? GLUCOSE 94 12/09/2021  ? CHOL 141 12/09/2021  ?  TRIG 131.0 12/09/2021  ? HDL 56.40 12/09/2021  ? [place] 58 12/09/2021  ? ALT 19 12/09/2021  ? AST 16 12/09/2021  ? NA 138 12/09/2021  ? K 4.4 12/09/2021  ? CL 104 12/09/2021  ? CREATININE 0.72 12/09/2021  ? BUN 10 12/09/2021  ? CO2 30 12/09/2021  ? TSH 4.17 01/23/2020  ? INR 0.95 10/13/2009  ? HGBA1C 5.6 10/25/2018  ? ? ? ?Assessment & Plan:  ? ? ?See Problem List for Assessment and Plan of chronic medical problems.  ? ? ?

## 2022-02-21 NOTE — Patient Instructions (Addendum)
? ? ?  It was nice to meet you.   ? ? ? ? ?Medications changes include :   none ? ? ? ? ?Return in about 1 year (around 02/23/2023) for follow up, sooner if needed. . ? ?

## 2022-02-22 ENCOUNTER — Ambulatory Visit (INDEPENDENT_AMBULATORY_CARE_PROVIDER_SITE_OTHER): Payer: Medicare Other | Admitting: Internal Medicine

## 2022-02-22 ENCOUNTER — Encounter: Payer: Self-pay | Admitting: Internal Medicine

## 2022-02-22 VITALS — BP 106/78 | HR 67 | Temp 98.0°F | Ht 69.5 in | Wt 182.2 lb

## 2022-02-22 DIAGNOSIS — K219 Gastro-esophageal reflux disease without esophagitis: Secondary | ICD-10-CM | POA: Diagnosis not present

## 2022-02-22 DIAGNOSIS — L719 Rosacea, unspecified: Secondary | ICD-10-CM

## 2022-02-22 DIAGNOSIS — M545 Low back pain, unspecified: Secondary | ICD-10-CM

## 2022-02-22 DIAGNOSIS — G8929 Other chronic pain: Secondary | ICD-10-CM

## 2022-02-22 DIAGNOSIS — E7849 Other hyperlipidemia: Secondary | ICD-10-CM | POA: Diagnosis not present

## 2022-02-22 MED ORDER — AZELEX 20 % EX CREA: TOPICAL_CREAM | Freq: Two times a day (BID) | CUTANEOUS | 0 refills | Status: AC

## 2022-02-22 MED ORDER — ROSUVASTATIN CALCIUM 20 MG PO TABS: 20.0000 mg | ORAL_TABLET | Freq: Every day | ORAL | 3 refills | Status: DC

## 2022-02-22 MED ORDER — VITAMIN B-12 500 MCG SL SUBL: SUBLINGUAL_TABLET | SUBLINGUAL | Status: AC

## 2022-02-22 NOTE — Assessment & Plan Note (Signed)
Chronic ?Regular exercise and healthy diet encouraged ?Lipids well controlled ?Continue Crestor 20 mg daily ?

## 2022-02-22 NOTE — Assessment & Plan Note (Signed)
Chronic ?Sees chiropracotr - q 2 weeks ?Has compressed disc ?

## 2022-02-22 NOTE — Assessment & Plan Note (Signed)
Chronic ?Dr. Renda Rolls ?On azelaic acid ?

## 2022-02-22 NOTE — Assessment & Plan Note (Signed)
Chronic ?Taking over-the-counter Nexium 40 mg daily ?Symptoms controlled ?Discussed potential long-term consequences of taking the medication ?She can try to not take it on occasion to see if she is symptomatic-May be able to decrease to every other day eventually ?

## 2022-03-01 NOTE — Progress Notes (Signed)
In ? ? ?Subjective:  ? ? Patient ID: Pamela Cameron, female    DOB: 1956-03-21, 67 y.o.   MRN: [id number] ? ?This visit occurred during the SARS-CoV-2 public health emergency.  Safety protocols were in place, including screening questions prior to the visit, additional usage of staff PPE, and extensive cleaning of exam room while observing appropriate contact time as indicated for disinfecting solutions. ? ? ? ?HPI ?Pamela Cameron is here for  ?Chief Complaint  ?Patient presents with  ? Cough  ?  Greenish mucus, cough, sinus headache pressure  ? ? ?She is here for an acute visit for cold symptoms.  ? ?Her symptoms started Tuesday last week ? ?She is experiencing nasal congestion with discolored mucus, postnasal drip, sinus pain, sore throat, cough that is productive and headaches.  She denies any fevers, shortness of breath or wheezing. ? ?She has tried taking  mucinex, sinus cold med, robitussin  ? ? ?Covid test neg ? ? ?Medications and allergies reviewed with patient and updated if appropriate. ? ?Current Outpatient Medications on File Prior to Visit  ?Medication Sig Dispense Refill  ? azelaic acid (AZELEX) 20 % cream Apply topically 2 (two) times daily. After skin is thoroughly washed and patted dry, gently but thoroughly massage a thin film of azelaic acid cream into the affected area twice daily, in the morning and evening. 30 g 0  ? Cholecalciferol (VITAMIN D3 PO) Take 50 mcg by mouth daily.    ? Cyanocobalamin (VITAMIN B-12) 500 MCG SUBL 500 mcg daily 30 tablet   ? Esomeprazole Magnesium (NEXIUM PO) Take by mouth daily.    ? estradiol (ESTRACE) 2 MG tablet Take 2 mg by mouth daily.    ? rosuvastatin (CRESTOR) 20 MG tablet Take 1 tablet (20 mg total) by mouth daily. 90 tablet 3  ? TURMERIC PO Take 1 tablet by mouth daily.    ? ?No current facility-administered medications on file prior to visit.  ? ? ?Review of Systems  ?Constitutional:  Negative for fever.  ?HENT:  Positive for congestion (green mucus), postnasal  drip, sinus pain and sore throat. Negative for ear pain.   ?Respiratory:  Positive for cough (productive - discolored mucus). Negative for shortness of breath and wheezing.   ?Neurological:  Positive for headaches. Negative for dizziness.  ? ?   ?Objective:  ? ?Vitals:  ? 03/02/22 0838  ?BP: 120/74  ?Pulse: 70  ?Temp: 97.9 ?F (36.6 ?C)  ?SpO2: 98%  ? ?BP Readings from Last 3 Encounters:  ?03/02/22 120/74  ?02/22/22 106/78  ?12/05/20 120/68  ? ?Wt Readings from Last 3 Encounters:  ?03/02/22 183 lb 12.8 oz (83.4 kg)  ?02/22/22 182 lb 3.2 oz (82.6 kg)  ?12/05/20 187 lb 6.4 oz (85 kg)  ? ?Body mass index is 26.75 kg/m?. ? ?  ?Physical Exam ?Constitutional:   ?   General: She is not in acute distress. ?   Appearance: Normal appearance. She is not ill-appearing.  ?HENT:  ?   Head: Normocephalic and atraumatic.  ?   Right Ear: Tympanic membrane, ear canal and external ear normal.  ?   Left Ear: Tympanic membrane, ear canal and external ear normal.  ?   Mouth/Throat:  ?   Mouth: Mucous membranes are moist.  ?   Pharynx: No oropharyngeal exudate or posterior oropharyngeal erythema.  ?Eyes:  ?   Conjunctiva/sclera: Conjunctivae normal.  ?Cardiovascular:  ?   Rate and Rhythm: Normal rate and regular rhythm.  ?Pulmonary:  ?  Effort: Pulmonary effort is normal. No respiratory distress.  ?   Breath sounds: Normal breath sounds. No wheezing or rales.  ?Musculoskeletal:  ?   Cervical back: Neck supple. No tenderness.  ?Lymphadenopathy:  ?   Cervical: No cervical adenopathy.  ?Skin: ?   General: Skin is warm and dry.  ?Neurological:  ?   Mental Status: She is alert.  ? ?   ? ? ? ? ? ?Assessment & Plan:  ? ? ?See Problem List for Assessment and Plan of chronic medical problems.  ? ? ? ? ?

## 2022-03-01 NOTE — Patient Instructions (Addendum)
? ? ? ? ?  Medications changes include :   augmentin twice daily for 10 days, tussionex cough syrup ? ? ?Your prescription(s) have been sent to your pharmacy.  ? ? ? ? ? ?Return if symptoms worsen or fail to improve. ? ?

## 2022-03-02 ENCOUNTER — Encounter: Payer: Self-pay | Admitting: Internal Medicine

## 2022-03-02 ENCOUNTER — Ambulatory Visit (INDEPENDENT_AMBULATORY_CARE_PROVIDER_SITE_OTHER): Payer: Medicare Other | Admitting: Internal Medicine

## 2022-03-02 DIAGNOSIS — J019 Acute sinusitis, unspecified: Secondary | ICD-10-CM | POA: Diagnosis not present

## 2022-03-02 MED ORDER — HYDROCOD POLI-CHLORPHE POLI ER 10-8 MG/5ML PO SUER: 5.0000 mL | Freq: Two times a day (BID) | ORAL | 0 refills | Status: DC | PRN

## 2022-03-02 MED ORDER — AMOXICILLIN-POT CLAVULANATE 875-125 MG PO TABS: 1.0000 | ORAL_TABLET | Freq: Two times a day (BID) | ORAL | 0 refills | Status: DC

## 2022-03-02 NOTE — Assessment & Plan Note (Signed)
Acute ?Likely bacterial  ?Start Augmentin 875-125 mg BID x 10 day ?Tussionex cough syrup 5 mL twice daily as needed ?otc cold medications ?Rest, fluid ?Call if no improvement ? ?

## 2022-03-07 ENCOUNTER — Encounter: Payer: Self-pay | Admitting: Internal Medicine

## 2022-05-06 ENCOUNTER — Ambulatory Visit: Payer: Medicare Other | Admitting: Internal Medicine

## 2022-05-17 ENCOUNTER — Ambulatory Visit
Admission: RE | Admit: 2022-05-17 | Discharge: 2022-05-17 | Disposition: A | Discharge status: Home / Self Care | Payer: Medicare Other | Source: Ambulatory Visit | Attending: Family Medicine | Admitting: Family Medicine

## 2022-05-17 ENCOUNTER — Other Ambulatory Visit: Payer: Self-pay | Admitting: Internal Medicine

## 2022-05-17 DIAGNOSIS — E2839 Other primary ovarian failure: Secondary | ICD-10-CM

## 2022-05-25 ENCOUNTER — Other Ambulatory Visit: Payer: Self-pay | Admitting: Internal Medicine

## 2022-05-25 DIAGNOSIS — E2839 Other primary ovarian failure: Secondary | ICD-10-CM

## 2022-06-23 ENCOUNTER — Telehealth: Payer: Self-pay | Admitting: Internal Medicine

## 2022-06-23 NOTE — Telephone Encounter (Signed)
Left message for patient to call back to schedule Medicare Annual Wellness Visit   Last AWV  06/24/21  Please schedule at anytime with [facility] if patient calls the office back.     Any questions, please call me at [phone]

## 2022-09-09 ENCOUNTER — Other Ambulatory Visit: Payer: Self-pay | Admitting: Internal Medicine

## 2022-09-09 DIAGNOSIS — N951 Menopausal and female climacteric states: Secondary | ICD-10-CM

## 2022-12-06 NOTE — Progress Notes (Unsigned)
Subjective:    Patient ID: Pamela Cameron, female    DOB: Jul 22, 1956, 68 y.o.   MRN: [id number]      HPI Pamela Cameron is here for No chief complaint on file.    Right side under breast is tender - noticed the pain about one week ago. The pain is a little better.  If she moves her right shoulder or arm she can feel it. Certain movements make it worse.  No trauma.  For a couple of days she did 5 lb weights but denies any pain while doing that.  She denies other repitive activity.  No breast lump.  No N/T or raidation of the pain.  Medications and allergies reviewed with patient and updated if appropriate.  Current Outpatient Medications on File Prior to Visit  Medication Sig Dispense Refill   azelaic acid (AZELEX) 20 % cream Apply topically 2 (two) times daily. After skin is thoroughly washed and patted dry, gently but thoroughly massage a thin film of azelaic acid cream into the affected area twice daily, in the morning and evening. 30 g 0   Cholecalciferol (VITAMIN D3 PO) Take 50 mcg by mouth daily.     Cyanocobalamin (VITAMIN B-12) 500 MCG SUBL 500 mcg daily 30 tablet    MAGNESIUM CITRATE PO Take by mouth.     Omega-3 Fatty Acids (FISH OIL PO) Take by mouth.     rosuvastatin (CRESTOR) 20 MG tablet Take 1 tablet (20 mg total) by mouth daily. 90 tablet 3   TURMERIC PO Take 1 tablet by mouth daily.     Esomeprazole Magnesium (NEXIUM PO) Take by mouth daily. (Patient not taking: Reported on 12/07/2022)     estradiol (ESTRACE) 2 MG tablet TAKE 1 TABLET BY MOUTH EVERY DAY (Patient not taking: Reported on 12/07/2022) 90 tablet 1   No current facility-administered medications on file prior to visit.    Review of Systems  Constitutional:  Negative for fever.  Respiratory:  Negative for cough, shortness of breath and wheezing.   Skin:  Negative for color change and rash.  Neurological:  Negative for weakness and numbness.       Objective:   Vitals:   12/07/22 1347  BP: 108/60   Pulse: 78  Temp: 98.1 F (36.7 C)  SpO2: 98%   BP Readings from Last 3 Encounters:  12/07/22 108/60  03/02/22 120/74  02/22/22 106/78   Wt Readings from Last 3 Encounters:  12/07/22 159 lb (72.1 kg)  03/02/22 183 lb 12.8 oz (83.4 kg)  02/22/22 182 lb 3.2 oz (82.6 kg)   Body mass index is 23.14 kg/m.    Physical Exam Constitutional:      General: She is not in acute distress.    Appearance: Normal appearance. She is not ill-appearing.  HENT:     Head: Normocephalic and atraumatic.  Cardiovascular:     Rate and Rhythm: Normal rate and regular rhythm.  Pulmonary:     Effort: Pulmonary effort is normal. No respiratory distress.     Breath sounds: No wheezing or rales.  Chest:  Breasts:    Right: Normal. No swelling, inverted nipple, mass, nipple discharge, skin change or tenderness.  Musculoskeletal:        General: Tenderness (right anterior ribs at central base of breast) present. No swelling or deformity.  Lymphadenopathy:     Upper Body:     Right upper body: No supraclavicular, axillary or pectoral adenopathy.  Skin:    General: Skin is warm  and dry.     Findings: No bruising, erythema or lesion.  Neurological:     Mental Status: She is alert.            Assessment & Plan:    See Problem List for Assessment and Plan of chronic medical problems.

## 2022-12-07 ENCOUNTER — Encounter: Payer: Self-pay | Admitting: Internal Medicine

## 2022-12-07 ENCOUNTER — Ambulatory Visit (INDEPENDENT_AMBULATORY_CARE_PROVIDER_SITE_OTHER): Payer: Medicare Other | Admitting: Internal Medicine

## 2022-12-07 VITALS — BP 108/60 | HR 78 | Temp 98.1°F | Ht 69.5 in | Wt 159.0 lb

## 2022-12-07 DIAGNOSIS — R0781 Pleurodynia: Secondary | ICD-10-CM

## 2022-12-07 DIAGNOSIS — N644 Mastodynia: Secondary | ICD-10-CM | POA: Diagnosis not present

## 2022-12-07 NOTE — Assessment & Plan Note (Signed)
Acute Difficult to know for sure if it is breast pain or rib pain, but it sounds more msk in nature and likely a pulled muscle in her rib cage Will get diag mammo or right breast to make sure the pain is not coming from the breast If msk expect to improve over the next week or so Avoid activities that cause pain Pain is not bad enough to take anything - can take advil if needed  If no improvement will get rib xray, referral to sports med

## 2022-12-07 NOTE — Patient Instructions (Addendum)
      A diagnostic mammogram and ultrasound was ordered of your right breast    Medications changes include :   none     Return if symptoms worsen or fail to improve.

## 2022-12-07 NOTE — Assessment & Plan Note (Signed)
Acute Tenderness at right breast 6 o'clock Difficult to know for sure if it is breast pain or rib pain, but it sounds more msk in nature and likely a pulled muscle in her rib cage Will get diagnostic mammo, [place] if needed of right breast to be on the safe side

## 2022-12-16 ENCOUNTER — Other Ambulatory Visit: Payer: Self-pay | Admitting: Internal Medicine

## 2022-12-16 ENCOUNTER — Ambulatory Visit
Admission: RE | Admit: 2022-12-16 | Discharge: 2022-12-16 | Disposition: A | Discharge status: Home / Self Care | Payer: Medicare Other | Source: Ambulatory Visit | Attending: Internal Medicine | Admitting: Internal Medicine

## 2022-12-16 DIAGNOSIS — R0781 Pleurodynia: Secondary | ICD-10-CM

## 2022-12-16 DIAGNOSIS — N644 Mastodynia: Secondary | ICD-10-CM

## 2022-12-20 ENCOUNTER — Other Ambulatory Visit: Payer: Self-pay | Admitting: Internal Medicine

## 2022-12-20 DIAGNOSIS — Z1231 Encounter for screening mammogram for malignant neoplasm of breast: Secondary | ICD-10-CM

## 2023-01-18 ENCOUNTER — Encounter: Payer: Self-pay | Admitting: Gastroenterology

## 2023-01-21 ENCOUNTER — Encounter: Payer: Self-pay | Admitting: Gastroenterology

## 2023-02-04 ENCOUNTER — Ambulatory Visit
Admission: RE | Admit: 2023-02-04 | Discharge: 2023-02-04 | Disposition: A | Discharge status: Home / Self Care | Payer: Medicare Other | Source: Ambulatory Visit | Attending: Internal Medicine | Admitting: Internal Medicine

## 2023-02-04 DIAGNOSIS — Z1231 Encounter for screening mammogram for malignant neoplasm of breast: Secondary | ICD-10-CM

## 2023-02-23 ENCOUNTER — Encounter: Payer: Self-pay | Admitting: Internal Medicine

## 2023-02-23 ENCOUNTER — Telehealth: Payer: Self-pay

## 2023-02-23 DIAGNOSIS — E538 Deficiency of other specified B group vitamins: Secondary | ICD-10-CM | POA: Insufficient documentation

## 2023-02-23 NOTE — Progress Notes (Unsigned)
Subjective:    Patient ID: Pamela Cameron, female    DOB: [birth year], 67 y.o.   MRN: [id number]     HPI Nadean is here for follow up of her chronic medical problems.  Hands or feet tingle occasionally at night.  She does not feel it during the day.  She is not sure of the circumstances.  On occasion she will experience a tightening in chest.  May last an hour.  Goes away.    Medications and allergies reviewed with patient and updated if appropriate.  Current Outpatient Medications on File Prior to Visit  Medication Sig Dispense Refill   azelaic acid (AZELEX) 20 % cream Apply topically 2 (two) times daily. After skin is thoroughly washed and patted dry, gently but thoroughly massage a thin film of azelaic acid cream into the affected area twice daily, in the morning and evening. 30 g 0   Cholecalciferol (VITAMIN D3 PO) Take 50 mcg by mouth daily.     Cyanocobalamin (VITAMIN B-12) 500 MCG SUBL 500 mcg daily 30 tablet    Omega-3 Fatty Acids (FISH OIL PO) Take by mouth.     rosuvastatin (CRESTOR) 20 MG tablet Take 1 tablet (20 mg total) by mouth daily. 90 tablet 3   TURMERIC PO Take 1 tablet by mouth daily.     No current facility-administered medications on file prior to visit.     Review of Systems  Constitutional:  Negative for fever.  Respiratory:  Negative for cough, shortness of breath and wheezing.   Cardiovascular:  Positive for chest pain. Negative for palpitations and leg swelling.  Gastrointestinal:  Negative for abdominal pain, blood in stool, constipation, diarrhea and nausea.  Musculoskeletal:  Positive for arthralgias (mild). Negative for back pain and neck pain.  Neurological:  Negative for light-headedness and headaches.  Psychiatric/Behavioral:  Negative for dysphoric mood. The patient is not nervous/anxious.        Objective:   Vitals:   02/24/23 1052  BP: 108/72  Pulse: 78  Temp: 98.1 F (36.7 C)  SpO2: 98%   BP Readings from Last 3  Encounters:  02/24/23 108/72  12/07/22 108/60  03/02/22 120/74   Wt Readings from Last 3 Encounters:  02/24/23 155 lb 3.2 oz (70.4 kg)  12/07/22 159 lb (72.1 kg)  03/02/22 183 lb 12.8 oz (83.4 kg)   Body mass index is 22.59 kg/m.    Physical Exam Constitutional:      General: She is not in acute distress.    Appearance: Normal appearance.  HENT:     Head: Normocephalic and atraumatic.  Eyes:     Conjunctiva/sclera: Conjunctivae normal.  Cardiovascular:     Rate and Rhythm: Normal rate and regular rhythm.     Heart sounds: Normal heart sounds.  Pulmonary:     Effort: Pulmonary effort is normal. No respiratory distress.     Breath sounds: Normal breath sounds. No wheezing.  Abdominal:     General: There is no distension.     Palpations: Abdomen is soft.     Tenderness: There is no abdominal tenderness.  Musculoskeletal:     Cervical back: Neck supple.     Right lower leg: No edema.     Left lower leg: No edema.  Lymphadenopathy:     Cervical: No cervical adenopathy.  Skin:    General: Skin is warm and dry.     Findings: No rash.  Neurological:     Mental Status: She is alert.  Mental status is at baseline.  Psychiatric:        Mood and Affect: Mood normal.        Behavior: Behavior normal.        Lab Results  Component Value Date   WBC 5.1 12/09/2021   HGB 13.1 12/09/2021   HCT 39.7 12/09/2021   PLT 215.0 12/09/2021   GLUCOSE 94 12/09/2021   CHOL 141 12/09/2021   TRIG 131.0 12/09/2021   HDL 56.40 12/09/2021   LDLCALC 58 12/09/2021   ALT 19 12/09/2021   AST 16 12/09/2021   NA 138 12/09/2021   K 4.4 12/09/2021   CL 104 12/09/2021   CREATININE 0.72 12/09/2021   BUN 10 12/09/2021   CO2 30 12/09/2021   TSH 4.17 01/23/2020   INR 0.95 10/13/2009   HGBA1C 5.6 10/25/2018     Assessment & Plan:    See Problem List for Assessment and Plan of chronic medical problems.

## 2023-02-23 NOTE — Patient Instructions (Addendum)
      Blood work was ordered.   The lab is on the first floor.    Medications changes include :   none    A Ct scan of your heart arteries.     Someone will call you to schedule an appointment.      Return in about 1 year (around 02/24/2024) for follow up.

## 2023-02-23 NOTE — Telephone Encounter (Signed)
Called patient to schedule Medicare Annual Wellness Visit (AWV). No voicemail available to leave a message.  Last date of AWV: 06/24/21  Please schedule an appointment at any time on annual wellness visit schedule.

## 2023-02-24 ENCOUNTER — Ambulatory Visit (INDEPENDENT_AMBULATORY_CARE_PROVIDER_SITE_OTHER): Payer: Medicare Other | Admitting: Internal Medicine

## 2023-02-24 VITALS — BP 108/72 | HR 78 | Temp 98.1°F | Ht 69.5 in | Wt 155.2 lb

## 2023-02-24 DIAGNOSIS — R202 Paresthesia of skin: Secondary | ICD-10-CM

## 2023-02-24 DIAGNOSIS — R0789 Other chest pain: Secondary | ICD-10-CM | POA: Diagnosis not present

## 2023-02-24 DIAGNOSIS — E7849 Other hyperlipidemia: Secondary | ICD-10-CM | POA: Diagnosis not present

## 2023-02-24 DIAGNOSIS — E538 Deficiency of other specified B group vitamins: Secondary | ICD-10-CM

## 2023-02-24 DIAGNOSIS — Z136 Encounter for screening for cardiovascular disorders: Secondary | ICD-10-CM

## 2023-02-24 LAB — CBC WITH DIFFERENTIAL/PLATELET
Basophils Absolute: 0 10*3/uL (ref 0.0–0.1)
Basophils Relative: 0.4 % (ref 0.0–3.0)
Eosinophils Absolute: 0 10*3/uL (ref 0.0–0.7)
Eosinophils Relative: 0.5 % (ref 0.0–5.0)
HCT: 42.7 % (ref 36.0–46.0)
Hemoglobin: 14.2 g/dL (ref 12.0–15.0)
Lymphocytes Relative: 28.6 % (ref 12.0–46.0)
Lymphs Abs: 1.7 10*3/uL (ref 0.7–4.0)
MCHC: 33.3 g/dL (ref 30.0–36.0)
MCV: 95.9 fl (ref 78.0–100.0)
Monocytes Absolute: 0.4 10*3/uL (ref 0.1–1.0)
Monocytes Relative: 6.5 % (ref 3.0–12.0)
Neutro Abs: 3.8 10*3/uL (ref 1.4–7.7)
Neutrophils Relative %: 64 % (ref 43.0–77.0)
Platelets: 217 10*3/uL (ref 150.0–400.0)
RBC: 4.45 Mil/uL (ref 3.87–5.11)
RDW: 13 % (ref 11.5–15.5)
WBC: 6 10*3/uL (ref 4.0–10.5)

## 2023-02-24 LAB — COMPREHENSIVE METABOLIC PANEL
ALT: 20 U/L (ref 0–35)
AST: 21 U/L (ref 0–37)
Albumin: 4.9 g/dL (ref 3.5–5.2)
Alkaline Phosphatase: 56 U/L (ref 39–117)
BUN: 11 mg/dL (ref 6–23)
CO2: 31 mEq/L (ref 19–32)
Calcium: 10.2 mg/dL (ref 8.4–10.5)
Chloride: 103 mEq/L (ref 96–112)
Creatinine, Ser: 0.68 mg/dL (ref 0.40–1.20)
GFR: 89.97 mL/min (ref >60.00)
Glucose, Bld: 95 mg/dL (ref 70–99)
Potassium: 4.6 mEq/L (ref 3.5–5.1)
Sodium: 141 mEq/L (ref 135–145)
Total Bilirubin: 0.6 mg/dL (ref 0.2–1.2)
Total Protein: 7.4 g/dL (ref 6.0–8.3)

## 2023-02-24 LAB — LIPID PANEL
Cholesterol: 142 mg/dL (ref 0–200)
HDL: 76.4 mg/dL (ref >39.00)
LDL Cholesterol: 53 mg/dL (ref 0–99)
NonHDL: 65.63
Total CHOL/HDL Ratio: 2
Triglycerides: 64 mg/dL (ref 0.0–149.0)
VLDL: 12.8 mg/dL (ref 0.0–40.0)

## 2023-02-24 LAB — TSH: TSH: 2.48 u[IU]/mL (ref 0.35–5.50)

## 2023-02-24 LAB — VITAMIN B12: Vitamin B-12: 708 pg/mL (ref 211–911)

## 2023-02-24 NOTE — Assessment & Plan Note (Signed)
New Has had a couple episodes of tightening in her chest that can last up to an hour-no pattern to when this happens Not related to activity, not related to eating ?  Cardiac versus GI CT coronary calcium score test ordered-depending on results may need further evaluation Advised to take a Tums or drink cold water when this happens She will call if this is not improving

## 2023-02-24 NOTE — Assessment & Plan Note (Signed)
Chronic Regular exercise and healthy diet encouraged Check lipids, cmp, tsh, cbc Continue Crestor 20 mg daily

## 2023-02-24 NOTE — Assessment & Plan Note (Addendum)
Chronic Taking B12 supp - continue Check B12 level, cbc

## 2023-02-24 NOTE — Assessment & Plan Note (Signed)
New Has intermittent tingling in her hands and feet at night Not sure if it is related to certain positions Does not feel it during the day Discussed possible causes She will monitor for now and let me know if this persists/worsens

## 2023-03-15 ENCOUNTER — Other Ambulatory Visit: Payer: Self-pay | Admitting: Internal Medicine

## 2023-04-05 ENCOUNTER — Ambulatory Visit (AMBULATORY_SURGERY_CENTER): Payer: Medicare Other

## 2023-04-05 VITALS — Ht 69.5 in | Wt 155.0 lb

## 2023-04-05 DIAGNOSIS — Z86010 Personal history of colonic polyps: Secondary | ICD-10-CM

## 2023-04-05 MED ORDER — NA SULFATE-K SULFATE-MG SULF 17.5-3.13-1.6 GM/177ML PO SOLN: 1.0000 | Freq: Once | ORAL | 0 refills | Status: AC

## 2023-04-05 NOTE — Progress Notes (Signed)
No egg or soy allergy known to patient  No issues known to pt with past sedation with any surgeries or procedures Patient denies ever being told they had issues or difficulty with intubation  No FH of Malignant Hyperthermia Pt is not on diet pills Pt is not on  home 02  Pt is not on blood thinners  Pt with occasional constipation take fiber OTC. 2 day prep given  No A fib or A flutter - right bundle branch block  Have any cardiac testing pending-- cardiac ct scoring on 5/30. No c/o of chest pain or cardiac issues from patient.   Patient's chart reviewed by Cathlyn Parsons CNRA prior to previsit and patient appropriate for the [facility].  Previsit completed and red dot placed by patient's name on their procedure day (on provider's schedule).      PV completed with patient. Prep instructions reviwed and sent to patient via mychart and to mailing address. Goodrx coupon for target provided. Patient to pick up additional prep items OTC for 2 day prep.  Pt instructed to use Singlecare.com or GoodRx for a price reduction on prep

## 2023-04-06 ENCOUNTER — Telehealth: Payer: Self-pay | Admitting: Radiology

## 2023-04-06 NOTE — Telephone Encounter (Signed)
Contacted Pamela Cameron to schedule their annual wellness visit. Patient declined to schedule AWV at this time.  Pamela Cameron CMA

## 2023-04-07 ENCOUNTER — Ambulatory Visit (HOSPITAL_COMMUNITY)
Admission: RE | Admit: 2023-04-07 | Discharge: 2023-04-07 | Disposition: A | Discharge status: Home / Self Care | Payer: Medicare Other | Source: Ambulatory Visit | Attending: Internal Medicine | Admitting: Internal Medicine

## 2023-04-07 DIAGNOSIS — Z136 Encounter for screening for cardiovascular disorders: Secondary | ICD-10-CM | POA: Insufficient documentation

## 2023-04-26 ENCOUNTER — Encounter: Payer: Medicare Other | Admitting: Gastroenterology

## 2023-05-31 ENCOUNTER — Encounter: Payer: Medicare Other | Admitting: Gastroenterology

## 2023-06-07 ENCOUNTER — Ambulatory Visit (AMBULATORY_SURGERY_CENTER): Payer: Medicare Other | Admitting: Gastroenterology

## 2023-06-07 ENCOUNTER — Encounter: Payer: Self-pay | Admitting: Gastroenterology

## 2023-06-07 VITALS — BP 125/63 | HR 49 | Temp 97.0°F | Resp 8 | Ht 69.5 in | Wt 155.0 lb

## 2023-06-07 DIAGNOSIS — Z86010 Personal history of colonic polyps: Secondary | ICD-10-CM | POA: Diagnosis not present

## 2023-06-07 DIAGNOSIS — Z09 Encounter for follow-up examination after completed treatment for conditions other than malignant neoplasm: Secondary | ICD-10-CM | POA: Diagnosis not present

## 2023-06-07 MED ORDER — SODIUM CHLORIDE 0.9 % IV SOLN: 500.0000 mL | INTRAVENOUS | Status: DC

## 2023-06-07 NOTE — Op Note (Signed)
[facility] Patient Name: Pamela Cameron Procedure Date: 06/07/2023 10:24 AM MRN: [id number] Endoscopist: Napoleon Form , MD, [id number] Age: 67 Referring MD:  Date of Birth: [birth year] Gender: Female Account #: [id number] Procedure:                Colonoscopy Indications:              High risk colon cancer surveillance: Personal                            history of colonic polyps, High risk colon cancer                            surveillance: Personal history of adenoma (10 mm or                            greater in size), High risk colon cancer                            surveillance: Personal history of adenoma with                            villous component Medicines:                Monitored Anesthesia Care Procedure:                Pre-Anesthesia Assessment:                           - Prior to the procedure, a History and Physical                            was performed, and patient medications and                            allergies were reviewed. The patient's tolerance of                            previous anesthesia was also reviewed. The risks                            and benefits of the procedure and the sedation                            options and risks were discussed with the patient.                            All questions were answered, and informed consent                            was obtained. Prior Anticoagulants: The patient has                            taken no anticoagulant or antiplatelet agents. ASA  Grade Assessment: II - A patient with mild systemic                            disease. After reviewing the risks and benefits,                            the patient was deemed in satisfactory condition to                            undergo the procedure.                           After obtaining informed consent, the colonoscope                            was passed under direct vision. Throughout the                             procedure, the patient's blood pressure, pulse, and                            oxygen saturations were monitored continuously. The                            Olympus PCF-[id number] (#[id number]) Colonoscope was                            introduced through the anus and advanced to the the                            cecum, identified by appendiceal orifice and                            ileocecal valve. The colonoscopy was performed                            without difficulty. The patient tolerated the                            procedure well. The quality of the bowel                            preparation was good. The ileocecal valve,                            appendiceal orifice, and rectum were photographed. Scope In: 10:28:01 AM Scope Out: 10:51:08 AM Scope Withdrawal Time: 0 hours 9 minutes 14 seconds  Total Procedure Duration: 0 hours 23 minutes 7 seconds  Findings:                 The perianal and digital rectal examinations were                            normal.  A few small-mouthed diverticula were found in the                            sigmoid colon.                           Non-bleeding external and internal hemorrhoids were                            found during retroflexion. The hemorrhoids were                            small.                           The exam was otherwise without abnormality. Complications:            No immediate complications. Estimated Blood Loss:     Estimated blood loss was minimal. Impression:               - Diverticulosis in the sigmoid colon.                           - Non-bleeding external and internal hemorrhoids.                           - The examination was otherwise normal.                           - No specimens collected. Recommendation:           - Patient has a contact number available for                            emergencies. The signs and symptoms of potential                             delayed complications were discussed with the                            patient. Return to normal activities tomorrow.                            Written discharge instructions were provided to the                            patient.                           - Resume previous diet.                           - Continue present medications.                           - Await pathology results.                           -  Repeat colonoscopy in 5 years for surveillance. Napoleon Form, MD 06/07/2023 10:58:56 AM This report has been signed electronically.

## 2023-06-07 NOTE — Progress Notes (Signed)
Pt's states no medical or surgical changes since previsit or office visit. 

## 2023-06-07 NOTE — Progress Notes (Signed)
Uneventful anesthetic. Report to pacu rn. Vss. Care resumed by rn. 

## 2023-06-07 NOTE — Patient Instructions (Signed)
Resume previous diet and continue current medications. Handout on diverticulosis given.       YOU HAD AN ENDOSCOPIC PROCEDURE TODAY AT THE [facility]:   Refer to the procedure report that was given to you for any specific questions about what was found during the examination.  If the procedure report does not answer your questions, please call your gastroenterologist to clarify.  If you requested that your care partner not be given the details of your procedure findings, then the procedure report has been included in a sealed envelope for you to review at your convenience later.  YOU SHOULD EXPECT: Some feelings of bloating in the abdomen. Passage of more gas than usual.  Walking can help get rid of the air that was put into your GI tract during the procedure and reduce the bloating. If you had a lower endoscopy (such as a colonoscopy or flexible sigmoidoscopy) you may notice spotting of blood in your stool or on the toilet paper. If you underwent a bowel prep for your procedure, you may not have a normal bowel movement for a few days.  Please Note:  You might notice some irritation and congestion in your nose or some drainage.  This is from the oxygen used during your procedure.  There is no need for concern and it should clear up in a day or so.  SYMPTOMS TO REPORT IMMEDIATELY:  Following lower endoscopy (colonoscopy or flexible sigmoidoscopy):  Excessive amounts of blood in the stool  Significant tenderness or worsening of abdominal pains  Swelling of the abdomen that is new, acute  Fever of 100F or higher  For urgent or emergent issues, a gastroenterologist can be reached at any hour by calling [phone]. Do not use MyChart messaging for urgent concerns.    DIET:  We do recommend a small meal at first, but then you may proceed to your regular diet.  Drink plenty of fluids but you should avoid alcoholic beverages for 24 hours.  ACTIVITY:  You should plan to take it  easy for the rest of today and you should NOT DRIVE or use heavy machinery until tomorrow (because of the sedation medicines used during the test).    FOLLOW UP: Our staff will call the number listed on your records the next business day following your procedure.  We will call around 7:15- 8:00 am to check on you and address any questions or concerns that you may have regarding the information given to you following your procedure. If we do not reach you, we will leave a message.     If any biopsies were taken you will be contacted by phone or by letter within the next 1-3 weeks.  Please call us at [phone] if you have not heard about the biopsies in 3 weeks.    SIGNATURES/CONFIDENTIALITY: You and/or your care partner have signed paperwork which will be entered into your electronic medical record.  These signatures attest to the fact that that the information above on your After Visit Summary has been reviewed and is understood.  Full responsibility of the confidentiality of this discharge information lies with you and/or your care-partner.
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
## 2023-06-08 ENCOUNTER — Telehealth: Payer: Self-pay

## 2023-06-08 NOTE — Telephone Encounter (Signed)
  Follow up Call-     06/07/2023    9:36 AM  Call back number  Post procedure Call Back phone  # [phone]  Permission to leave phone message Yes     Patient questions:  Do you have a fever, pain , or abdominal swelling? No. Pain Score  0 *  Have you tolerated food without any problems? Yes.    Have you been able to return to your normal activities? Yes.    Do you have any questions about your discharge instructions: Diet   No. Medications  No. Follow up visit  No.  Do you have questions or concerns about your Care? No.  Actions: * If pain score is 4 or above: No action needed, pain <4.

## 2023-08-15 NOTE — Progress Notes (Unsigned)
    Subjective:    Patient ID: Pamela Cameron, female    DOB: [birth year], 67 y.o.   MRN: [id number]      HPI Pamela Cameron is here for No chief complaint on file.    ? UTI:  Her symptoms started  *** days ago.  She states dysuria, urinary frequency, urinary urgency, hematuria, abdominal pain, back pain, nausea, fever.  She denies other symptoms.      Medications and allergies reviewed with patient and updated if appropriate.  Current Outpatient Medications on File Prior to Visit  Medication Sig Dispense Refill   azelaic acid (AZELEX) 20 % cream Apply topically 2 (two) times daily. After skin is thoroughly washed and patted dry, gently but thoroughly massage a thin film of azelaic acid cream into the affected area twice daily, in the morning and evening. 30 g 0   Cholecalciferol (VITAMIN D3 PO) Take 50 mcg by mouth daily.     Cyanocobalamin (VITAMIN B-12) 500 MCG SUBL 500 mcg daily 30 tablet    Omega-3 Fatty Acids (FISH OIL PO) Take by mouth.     Probiotic Product (PROBIOTIC DAILY) CAPS Take 1 tablet by mouth daily.     rosuvastatin (CRESTOR) 20 MG tablet TAKE 1 TABLET BY MOUTH EVERY DAY 90 tablet 3   TURMERIC PO Take 1 tablet by mouth daily.     No current facility-administered medications on file prior to visit.    Review of Systems     Objective:  There were no vitals filed for this visit. BP Readings from Last 3 Encounters:  06/07/23 125/63  02/24/23 108/72  12/07/22 108/60   Wt Readings from Last 3 Encounters:  06/07/23 155 lb (70.3 kg)  04/05/23 155 lb (70.3 kg)  02/24/23 155 lb 3.2 oz (70.4 kg)   There is no height or weight on file to calculate BMI.    Physical Exam         Assessment & Plan:    See Problem List for Assessment and Plan of chronic medical problems.

## 2023-08-16 ENCOUNTER — Encounter: Payer: Self-pay | Admitting: Internal Medicine

## 2023-08-16 ENCOUNTER — Ambulatory Visit (INDEPENDENT_AMBULATORY_CARE_PROVIDER_SITE_OTHER): Payer: Medicare Other | Admitting: Internal Medicine

## 2023-08-16 VITALS — BP 106/74 | HR 80 | Temp 98.4°F | Ht 69.5 in | Wt 158.0 lb

## 2023-08-16 DIAGNOSIS — R300 Dysuria: Secondary | ICD-10-CM

## 2023-08-16 DIAGNOSIS — N3000 Acute cystitis without hematuria: Secondary | ICD-10-CM | POA: Diagnosis not present

## 2023-08-16 LAB — POC URINALSYSI DIPSTICK (AUTOMATED)
Bilirubin, UA: NEGATIVE
Blood, UA: NEGATIVE
Glucose, UA: NEGATIVE
Ketones, UA: NEGATIVE
Nitrite, UA: POSITIVE
Protein, UA: NEGATIVE
Spec Grav, UA: 1.02 (ref 1.010–1.025)
Urobilinogen, UA: 0.2 U/dL
pH, UA: 6 (ref 5.0–8.0)

## 2023-08-16 MED ORDER — CEPHALEXIN 500 MG PO CAPS: 500.0000 mg | ORAL_CAPSULE | Freq: Two times a day (BID) | ORAL | 0 refills | Status: DC

## 2023-08-16 NOTE — Assessment & Plan Note (Signed)
Acute Urine dip consistent with UTI Will send urine for culture Take the antibiotic as prescribed.  Keflex 500 mg bid x 7 days Take tylenol if needed.   Increase your water intake.  Call if no improvement

## 2023-08-16 NOTE — Patient Instructions (Signed)
Take the antibiotic as prescribed.  Take tylenol if needed.     Increase your water intake.   Call if no improvement     Urinary Tract Infection, Adult A urinary tract infection (UTI) is an infection of any part of the urinary tract, which includes the kidneys, ureters, bladder, and urethra. These organs make, store, and get rid of urine in the body. UTI can be a bladder infection (cystitis) or kidney infection (pyelonephritis). What are the causes? This infection may be caused by fungi, viruses, or bacteria. Bacteria are the most common cause of UTIs. This condition can also be caused by repeated incomplete emptying of the bladder during urination. What increases the risk? This condition is more likely to develop if:  You ignore your need to urinate or hold urine for long periods of time.  You do not empty your bladder completely during urination.  You wipe back to front after urinating or having a bowel movement, if you are female.  You are uncircumcised, if you are female.  You are constipated.  You have a urinary catheter that stays in place (indwelling).  You have a weak defense (immune) system.  You have a medical condition that affects your bowels, kidneys, or bladder.  You have diabetes.  You take antibiotic medicines frequently or for long periods of time, and the antibiotics no longer work well against certain types of infections (antibiotic resistance).  You take medicines that irritate your urinary tract.  You are exposed to chemicals that irritate your urinary tract.  You are female.  What are the signs or symptoms? Symptoms of this condition include:  Fever.  Frequent urination or passing small amounts of urine frequently.  Needing to urinate urgently.  Pain or burning with urination.  Urine that smells bad or unusual.  Cloudy urine.  Pain in the lower abdomen or back.  Trouble urinating.  Blood in the urine.  Vomiting or being less hungry than  normal.  Diarrhea or abdominal pain.  Vaginal discharge, if you are female.  How is this diagnosed? This condition is diagnosed with a medical history and physical exam. You will also need to provide a urine sample to test your urine. Other tests may be done, including:  Blood tests.  Sexually transmitted disease (STD) testing.  If you have had more than one UTI, a cystoscopy or imaging studies may be done to determine the cause of the infections. How is this treated? Treatment for this condition often includes a combination of two or more of the following:  Antibiotic medicine.  Other medicines to treat less common causes of UTI.  Over-the-counter medicines to treat pain.  Drinking enough water to stay hydrated.  Follow these instructions at home:  Take over-the-counter and prescription medicines only as told by your health care provider.  If you were prescribed an antibiotic, take it as told by your health care provider. Do not stop taking the antibiotic even if you start to feel better.  Avoid alcohol, caffeine, tea, and carbonated beverages. They can irritate your bladder.  Drink enough fluid to keep your urine clear or pale yellow.  Keep all follow-up visits as told by your health care provider. This is important.  Make sure to: ? Empty your bladder often and completely. Do not hold urine for long periods of time. ? Empty your bladder before and after sex. ? Wipe from front to back after a bowel movement if you are female. Use each tissue one time when you   wipe. Contact a health care provider if:  You have back pain.  You have a fever.  You feel nauseous or vomit.  Your symptoms do not get better after 3 days.  Your symptoms go away and then return. Get help right away if:  You have severe back pain or lower abdominal pain.  You are vomiting and cannot keep down any medicines or water. This information is not intended to replace advice given to you by  your health care provider. Make sure you discuss any questions you have with your health care provider. Document Released: 08/04/2005 Document Revised: 04/07/2016 Document Reviewed: 09/15/2015 Elsevier Interactive Patient Education  2018 Elsevier Inc[employer]   

## 2023-08-18 LAB — CULTURE, URINE COMPREHENSIVE

## 2023-08-26 ENCOUNTER — Telehealth: Payer: Self-pay | Admitting: Internal Medicine

## 2023-08-26 NOTE — Telephone Encounter (Signed)
Pt was seen for an UTI and wanting to know if she can get some more antibiotics she is stating it haven't fully went away. Please advise.

## 2023-08-29 ENCOUNTER — Other Ambulatory Visit: Payer: Self-pay

## 2023-08-29 DIAGNOSIS — R300 Dysuria: Secondary | ICD-10-CM

## 2023-08-29 MED ORDER — SULFAMETHOXAZOLE-TRIMETHOPRIM 800-160 MG PO TABS: 1.0000 | ORAL_TABLET | Freq: Two times a day (BID) | ORAL | 0 refills | Status: AC

## 2023-08-29 NOTE — Addendum Note (Signed)
Addended by: Pincus Sanes on: 08/29/2023 12:19 PM   Modules accepted: Orders

## 2023-08-29 NOTE — Telephone Encounter (Signed)
Should ideally check urine again

## 2023-08-29 NOTE — Telephone Encounter (Signed)
Pharmacy added if script is being sent

## 2023-08-29 NOTE — Telephone Encounter (Signed)
Abx sent to pharmacy - will do a different abx since the other one did not work  --this 1 is twice daily for 5 days.

## 2023-08-29 NOTE — Telephone Encounter (Signed)
Spoke with patient today. 

## 2023-09-06 ENCOUNTER — Encounter: Payer: Self-pay | Admitting: Internal Medicine

## 2023-09-06 NOTE — Progress Notes (Unsigned)
    Subjective:    Patient ID: Pamela Cameron, female    DOB: May 12, 1956, 67 y.o.   MRN: [id number]      HPI Busra is here for No chief complaint on file.   She was here 10/8 -  rx'd kelfex  ( cx ecoli - pansensitive)  10/18 - still felt symptoms - bactrim ds x 5 days     Medications and allergies reviewed with patient and updated if appropriate.  Current Outpatient Medications on File Prior to Visit  Medication Sig Dispense Refill   azelaic acid (AZELEX) 20 % cream Apply topically 2 (two) times daily. After skin is thoroughly washed and patted dry, gently but thoroughly massage a thin film of azelaic acid cream into the affected area twice daily, in the morning and evening. 30 g 0   Cholecalciferol (VITAMIN D3 PO) Take 50 mcg by mouth daily.     Cyanocobalamin (VITAMIN B-12) 500 MCG SUBL 500 mcg daily 30 tablet    Omega-3 Fatty Acids (FISH OIL PO) Take by mouth.     Probiotic Product (PROBIOTIC DAILY) CAPS Take 1 tablet by mouth daily.     rosuvastatin (CRESTOR) 20 MG tablet TAKE 1 TABLET BY MOUTH EVERY DAY 90 tablet 3   TURMERIC PO Take 1 tablet by mouth daily.     No current facility-administered medications on file prior to visit.    Review of Systems     Objective:  There were no vitals filed for this visit. BP Readings from Last 3 Encounters:  08/16/23 106/74  06/07/23 125/63  02/24/23 108/72   Wt Readings from Last 3 Encounters:  08/16/23 158 lb (71.7 kg)  06/07/23 155 lb (70.3 kg)  04/05/23 155 lb (70.3 kg)   There is no height or weight on file to calculate BMI.    Physical Exam         Assessment & Plan:    See Problem List for Assessment and Plan of chronic medical problems.

## 2023-09-07 ENCOUNTER — Other Ambulatory Visit: Payer: Self-pay

## 2023-09-07 ENCOUNTER — Ambulatory Visit (INDEPENDENT_AMBULATORY_CARE_PROVIDER_SITE_OTHER): Payer: Medicare Other | Admitting: Internal Medicine

## 2023-09-07 DIAGNOSIS — R300 Dysuria: Secondary | ICD-10-CM | POA: Diagnosis not present

## 2023-09-07 DIAGNOSIS — N3000 Acute cystitis without hematuria: Secondary | ICD-10-CM | POA: Insufficient documentation

## 2023-09-07 LAB — UNLABELED: Test Ordered On Req: 3021

## 2023-09-07 NOTE — Patient Instructions (Addendum)
      Your preliminary urine does not show an obvious infection - we will send this for culture.      Medications changes include :  none - take otc AZO for bladder symptoms.     Depending on urine culture results and symptoms we can refer you to a urologist or see your gynecologist if needed, but lets see what the culture shows.

## 2023-09-07 NOTE — Assessment & Plan Note (Signed)
UTI 10/8 - treated with keflex bid x 7 d - symptoms improved but still present 10/18 - prescribed Bactrim ds x 5 days - symptoms improved but still present Has occ dysuria, urethral pressure and dark urine - home test positive  U dip here - negative -- will send for culture and only treat if positive If culture negative can consider gyn or uro evaluation Stopped HT about 9 months - denies symptoms suggestive of vaginal atrophy Can take AZO if needed but symptoms are mild

## 2023-09-11 LAB — CULTURE, URINE COMPREHENSIVE: RESULT:: NO GROWTH

## 2023-09-11 LAB — PAT ID TIQ DOC

## 2023-09-27 ENCOUNTER — Ambulatory Visit (INDEPENDENT_AMBULATORY_CARE_PROVIDER_SITE_OTHER): Payer: Medicare Other | Admitting: Student

## 2023-09-27 ENCOUNTER — Encounter (HOSPITAL_BASED_OUTPATIENT_CLINIC_OR_DEPARTMENT_OTHER): Payer: Self-pay | Admitting: Student

## 2023-09-27 ENCOUNTER — Ambulatory Visit (INDEPENDENT_AMBULATORY_CARE_PROVIDER_SITE_OTHER): Payer: Medicare Other

## 2023-09-27 DIAGNOSIS — R0781 Pleurodynia: Secondary | ICD-10-CM
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
## 2023-11-07 IMAGING — MG MM DIGITAL SCREENING BILAT W/ TOMO AND CAD
8 series · 8 of 24 positions shown · non-contrast
Comparison: Previous exam(s).

CLINICAL DATA: Screening.

EXAM:
DIGITAL SCREENING BILATERAL MAMMOGRAM WITH TOMOSYNTHESIS AND CAD
TECHNIQUE: Bilateral screening digital craniocaudal and mediolateral oblique
mammograms were obtained. Bilateral screening digital breast
tomosynthesis was performed. The images were evaluated with
computer-aided detection.

[R MLO synth-2D]
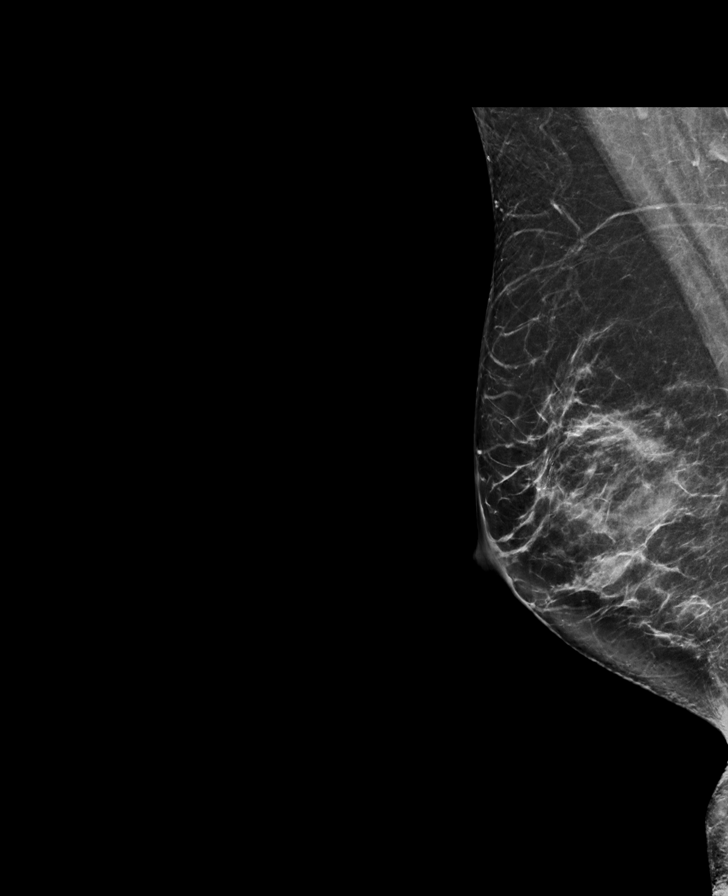

[L CC synth-2D]
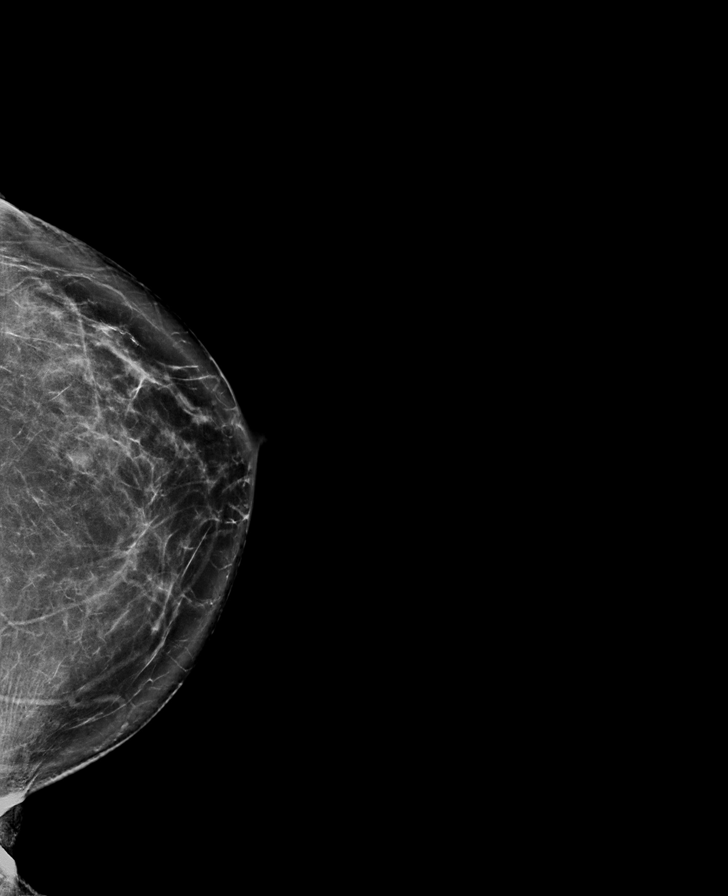

[L MLO synth-2D]
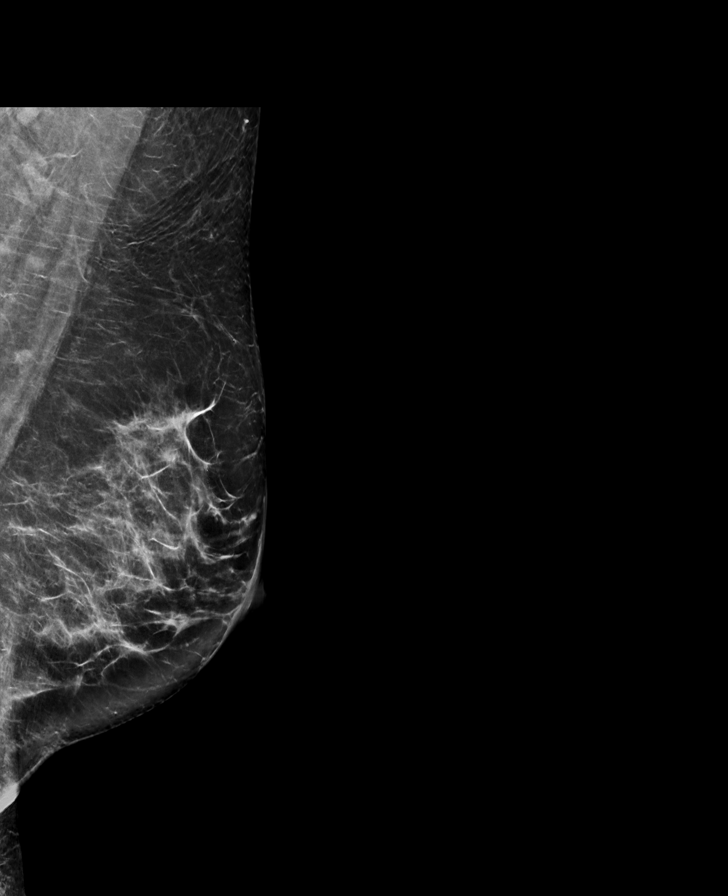

[R CC synth-2D]
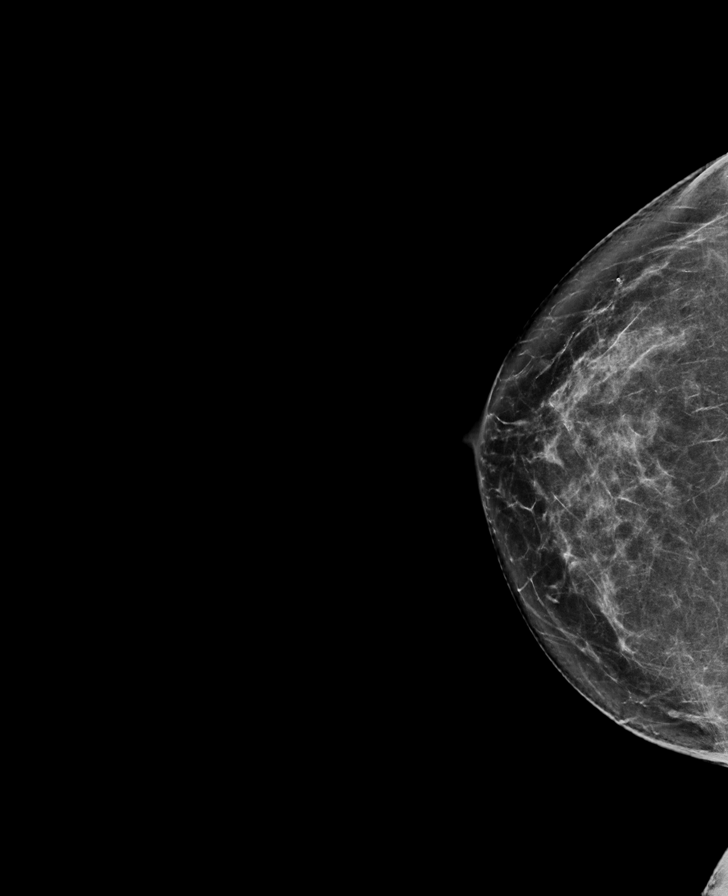

[R MLO tomo · tomo slice 41/81.0]
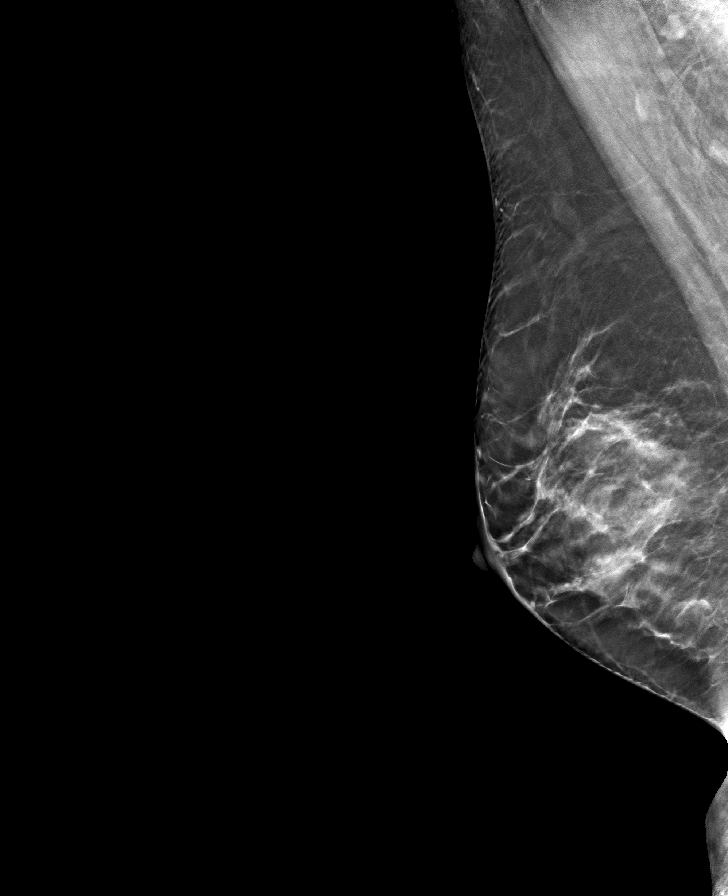

[L CC tomo · tomo slice 45/88.0]
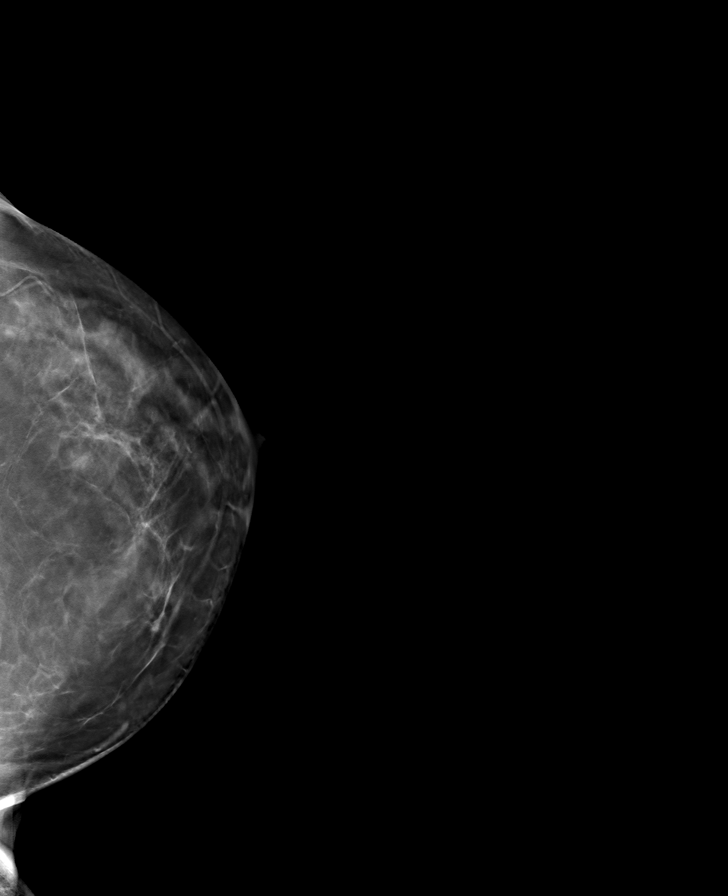

[L MLO tomo · tomo slice 44/87.0]
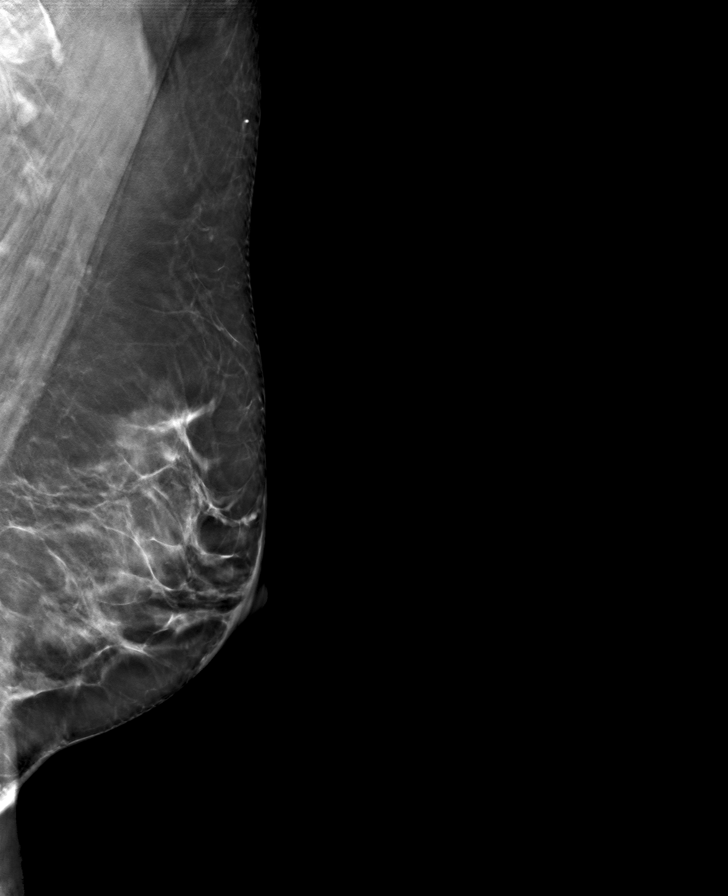

[R CC tomo · tomo slice 43/84.0]
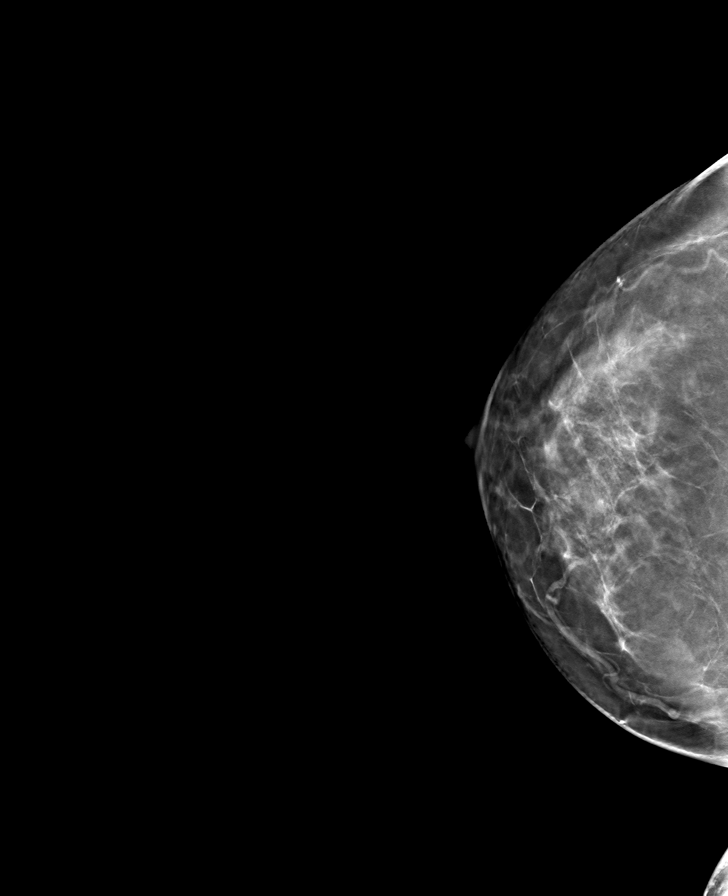

[8 of 24 positions shown; findings below may reference images not displayed]

ACR Breast Density Category c: The breast tissue is heterogeneously
dense, which may obscure small masses.
FINDINGS: There are no findings suspicious for malignancy.
IMPRESSION: No mammographic evidence of malignancy. A result letter of this
screening mammogram will be mailed directly to the patient.

RECOMMENDATION:
Screening mammogram in one year. (Code:[id number]-BC3)

BI-RADS CATEGORY  1: Negative.

## 2023-12-28 ENCOUNTER — Other Ambulatory Visit: Payer: Self-pay | Admitting: Internal Medicine

## 2023-12-28 ENCOUNTER — Other Ambulatory Visit: Payer: Self-pay | Admitting: Obstetrics and Gynecology

## 2023-12-28 DIAGNOSIS — Z1231 Encounter for screening mammogram for malignant neoplasm of breast: Secondary | ICD-10-CM

## 2024-02-13 ENCOUNTER — Ambulatory Visit
Admission: RE | Admit: 2024-02-13 | Discharge: 2024-02-13 | Disposition: A | Discharge status: Home / Self Care | Payer: Medicare Other | Source: Ambulatory Visit | Attending: Internal Medicine

## 2024-02-13 DIAGNOSIS — Z1231 Encounter for screening mammogram for malignant neoplasm of breast: Secondary | ICD-10-CM

## 2024-03-14 ENCOUNTER — Other Ambulatory Visit: Payer: Self-pay | Admitting: Internal Medicine

## 2024-04-20 ENCOUNTER — Ambulatory Visit

## 2024-05-21 ENCOUNTER — Ambulatory Visit

## 2024-06-12 ENCOUNTER — Ambulatory Visit (INDEPENDENT_AMBULATORY_CARE_PROVIDER_SITE_OTHER)

## 2024-06-12 VITALS — Ht 69.5 in | Wt 161.0 lb

## 2024-06-12 DIAGNOSIS — Z0000 Encounter for general adult medical examination without abnormal findings: Secondary | ICD-10-CM

## 2024-06-12 NOTE — Patient Instructions (Signed)
 Ms. Pamela Cameron , Thank you for taking time out of your busy schedule to complete your Annual Wellness Visit with me. I enjoyed our conversation and look forward to speaking with you again next year. I, as well as your care team,  appreciate your ongoing commitment to your health goals. Please review the following plan we discussed and let me know if I can assist you in the future. Your Game plan/ To Do List    Referrals: If you haven't heard from the office you've been referred to, please reach out to them at the phone provided.   Follow up Visits: We will see or speak with you next year for your Next Medicare AWV with our clinical staff Have you seen your provider in the last 6 months (3 months if uncontrolled diabetes)? No  Clinician Recommendations:  Aim for 30 minutes of exercise or brisk walking, 6-8 glasses of water, and 5 servings of fruits and vegetables each day. You are due for a tetanus vaccine and a Hep B vaccine (does #2).  You can get these done at your local pharmacy.        This is a list of the screenings recommended for you:  Health Maintenance  Topic Date Due   DTaP/Tdap/Td vaccine (1 - Tdap) Never done   Hepatitis B Vaccine (2 of 3 - 19+ 3-dose series) 07/06/2017   Medicare Annual Wellness Visit  06/24/2022   COVID-19 Vaccine (5 - 2024-25 season) 11/01/2023   Flu Shot  06/08/2024   Mammogram  02/12/2025   DEXA scan (bone density measurement)  05/18/2027   Colon Cancer Screening  06/06/2028   Pneumococcal Vaccine for age over 34  Completed   Hepatitis C Screening  Completed   Zoster (Shingles) Vaccine  Completed   HPV Vaccine  Aged Out   Meningitis B Vaccine  Aged Out    Advanced directives: (Copy Requested) Please bring a copy of your health care power of attorney and living will to the office to be added to your chart at your convenience. You can mail to [facility] [address] [place], [place] [address] or email to  [email or web address] Advance Care Planning is important because it:  [x]  Makes sure you receive the medical care that is consistent with your values, goals, and preferences  [x]  It provides guidance to your family and loved ones and reduces their decisional burden about whether or not they are making the right decisions based on your wishes.  Follow the link provided in your after visit summary or read over the paperwork we have mailed to you to help you started getting your Advance Directives in place. If you need assistance in completing these, please reach out to us  so that we can help you!  See attachments for Preventive Care and Fall Prevention Tips.
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
## 2024-06-27 ENCOUNTER — Ambulatory Visit: Admitting: Internal Medicine

## 2024-08-12 ENCOUNTER — Encounter: Payer: Self-pay | Admitting: Internal Medicine

## 2024-08-12 NOTE — Progress Notes (Unsigned)
 Subjective:    Patient ID: Pamela Cameron, female    DOB: 09-06-1956, 68 y.o.   MRN: [id number]     HPI Pamela Cameron is here for follow up of her chronic medical problems.  Two new grandchildren - now has 8.   Having some dysuria and odor for 10 days or so.  Did monistat.  Still having symptoms  Going to the gym - joined two weeks ago.     Medications and allergies reviewed with patient and updated if appropriate.  Current Outpatient Medications on File Prior to Visit  Medication Sig Dispense Refill  . azelaic acid  (AZELEX ) 20 % cream Apply topically 2 (two) times daily. After skin is thoroughly washed and patted dry, gently but thoroughly massage a thin film of azelaic acid  cream into the affected area twice daily, in the morning and evening. 30 g 0  . Cholecalciferol (VITAMIN D3 PO) Take 50 mcg by mouth daily.    . Cyanocobalamin  (VITAMIN B-12) 500 MCG SUBL 500 mcg daily 30 tablet   . MAGNESIUM  CITRATE PO Take by mouth.    . Omega-3 Fatty Acids (FISH OIL PO) Take by mouth.    . Probiotic Product (PROBIOTIC DAILY) CAPS Take 1 tablet by mouth daily.    . rosuvastatin  (CRESTOR ) 20 MG tablet TAKE 1 TABLET BY MOUTH EVERY DAY 90 tablet 3  . TURMERIC PO Take 1 tablet by mouth daily.     No current facility-administered medications on file prior to visit.     Review of Systems  Constitutional:  Negative for fever.  Respiratory:  Negative for cough, shortness of breath and wheezing.   Cardiovascular:  Negative for chest pain, palpitations and leg swelling.  Gastrointestinal:  Positive for constipation. Negative for abdominal pain, blood in stool and diarrhea.       No gerd  Genitourinary:  Positive for dysuria. Negative for difficulty urinating, hematuria, urgency and vaginal discharge.       Urine odor  Musculoskeletal:  Negative for arthralgias and back pain.  Neurological:  Negative for dizziness, light-headedness and headaches.  Psychiatric/Behavioral:  Negative for  dysphoric mood. The patient is not nervous/anxious.        Objective:   Vitals:   08/13/24 1419  BP: 108/74  Pulse: 82  Temp: 98 F (36.7 C)  SpO2: 97%   BP Readings from Last 3 Encounters:  08/13/24 108/74  09/07/23 104/70  08/16/23 106/74   Wt Readings from Last 3 Encounters:  08/13/24 165 lb (74.8 kg)  06/12/24 161 lb (73 kg)  09/07/23 161 lb (73 kg)   Body mass index is 24.02 kg/m.    Physical Exam Constitutional:      General: She is not in acute distress.    Appearance: Normal appearance.  HENT:     Head: Normocephalic and atraumatic.  Eyes:     Conjunctiva/sclera: Conjunctivae normal.  Cardiovascular:     Rate and Rhythm: Normal rate and regular rhythm.     Heart sounds: Normal heart sounds.  Pulmonary:     Effort: Pulmonary effort is normal. No respiratory distress.     Breath sounds: Normal breath sounds. No wheezing.  Abdominal:     General: There is no distension.     Palpations: Abdomen is soft.     Tenderness: There is no abdominal tenderness.  Musculoskeletal:     Cervical back: Neck supple.     Right lower leg: No edema.     Left lower leg: No  edema.  Lymphadenopathy:     Cervical: No cervical adenopathy.  Skin:    General: Skin is warm and dry.     Findings: No rash.  Neurological:     Mental Status: She is alert. Mental status is at baseline.  Psychiatric:        Mood and Affect: Mood normal.        Behavior: Behavior normal.        Lab Results  Component Value Date   WBC 6.0 02/24/2023   HGB 14.2 02/24/2023   HCT 42.7 02/24/2023   PLT 217.0 02/24/2023   GLUCOSE 95 02/24/2023   CHOL 142 02/24/2023   TRIG 64.0 02/24/2023   HDL 76.40 02/24/2023   LDLCALC 53 02/24/2023   ALT 20 02/24/2023   AST 21 02/24/2023   NA 141 02/24/2023   K 4.6 02/24/2023   CL 103 02/24/2023   CREATININE 0.68 02/24/2023   BUN 11 02/24/2023   CO2 31 02/24/2023   TSH 2.48 02/24/2023   INR 0.95 10/13/2009   HGBA1C 5.6 10/25/2018     Assessment  & Plan:    See Problem List for Assessment and Plan of chronic medical problems.

## 2024-08-12 NOTE — Patient Instructions (Addendum)
      Blood work was ordered.       Medications changes include :   keflex  - UTI, decrease crestor  to 10 mg daily.        Return in about 1 year (around 08/13/2025) for follow up, schedule lab appt for 6 weeks.

## 2024-08-13 ENCOUNTER — Ambulatory Visit (INDEPENDENT_AMBULATORY_CARE_PROVIDER_SITE_OTHER): Admitting: Internal Medicine

## 2024-08-13 VITALS — BP 108/74 | HR 82 | Temp 98.0°F | Ht 69.5 in | Wt 165.0 lb

## 2024-08-13 DIAGNOSIS — E7849 Other hyperlipidemia: Secondary | ICD-10-CM | POA: Diagnosis not present

## 2024-08-13 DIAGNOSIS — R202 Paresthesia of skin: Secondary | ICD-10-CM

## 2024-08-13 DIAGNOSIS — E538 Deficiency of other specified B group vitamins: Secondary | ICD-10-CM

## 2024-08-13 DIAGNOSIS — R300 Dysuria: Secondary | ICD-10-CM | POA: Diagnosis not present

## 2024-08-13 DIAGNOSIS — N3000 Acute cystitis without hematuria: Secondary | ICD-10-CM

## 2024-08-13 LAB — TSH: TSH: 2.76 u[IU]/mL (ref 0.35–5.50)

## 2024-08-13 LAB — CBC
HCT: 40.4 % (ref 36.0–46.0)
Hemoglobin: 13.4 g/dL (ref 12.0–15.0)
MCHC: 33.1 g/dL (ref 30.0–36.0)
MCV: 94.6 fl (ref 78.0–100.0)
Platelets: 201 K/uL (ref 150.0–400.0)
RBC: 4.27 Mil/uL (ref 3.87–5.11)
RDW: 13.1 % (ref 11.5–15.5)
WBC: 5.4 K/uL (ref 4.0–10.5)

## 2024-08-13 LAB — POC URINALSYSI DIPSTICK (AUTOMATED)
Bilirubin, UA: NEGATIVE
Blood, UA: NEGATIVE
Glucose, UA: NEGATIVE
Ketones, UA: NEGATIVE
Nitrite, UA: NEGATIVE
Protein, UA: NEGATIVE
Spec Grav, UA: <=1.005 — AB (ref 1.010–1.025)
Urobilinogen, UA: 0.2 U/dL
pH, UA: 6.5 (ref 5.0–8.0)

## 2024-08-13 LAB — VITAMIN B12: Vitamin B-12: 754 pg/mL (ref 211–911)

## 2024-08-13 MED ORDER — ROSUVASTATIN CALCIUM 20 MG PO TABS: 10.0000 mg | ORAL_TABLET | Freq: Every day | ORAL | Status: DC

## 2024-08-13 MED ORDER — CEPHALEXIN 500 MG PO CAPS: 500.0000 mg | ORAL_CAPSULE | Freq: Two times a day (BID) | ORAL | 0 refills | Status: DC

## 2024-08-13 NOTE — Assessment & Plan Note (Signed)
 Acute Urine dip consistent with UTI Will send urine for culture Take keflex  500 mg bid x 7 days Take tylenol if needed.   Increase your water intake.  Call if no improvement

## 2024-08-13 NOTE — Assessment & Plan Note (Signed)
 Chronic Regular exercise and healthy diet encouraged Check lipids, cmp, tsh, cbc Decrease Crestor   to 10 mg daily - likely that is enough and she would like to be on a lower dose Recheck lipids, liver tests in 6 weeks

## 2024-08-13 NOTE — Assessment & Plan Note (Signed)
Chronic Taking B12 supp - continue Check B12 level, cbc

## 2024-08-13 NOTE — Assessment & Plan Note (Addendum)
 chronic Has intermittent tingling in her hands and feet when she wakes up in the morning Taking B12 Hands are more consistent  Feet 2-3 times in past couple of months No  Unlikely anything concerning - monitor for now

## 2024-08-14 LAB — LIPID PANEL
Cholesterol: 162 mg/dL (ref 0–200)
HDL: 75.1 mg/dL
LDL Cholesterol: 71 mg/dL (ref 0–99)
NonHDL: 86.74
Total CHOL/HDL Ratio: 2
Triglycerides: 78 mg/dL (ref 0.0–149.0)
VLDL: 15.6 mg/dL (ref 0.0–40.0)

## 2024-08-14 LAB — COMPREHENSIVE METABOLIC PANEL WITH GFR
ALT: 22 U/L (ref 0–35)
AST: 22 U/L (ref 0–37)
Albumin: 4.8 g/dL (ref 3.5–5.2)
Alkaline Phosphatase: 60 U/L (ref 39–117)
BUN: 13 mg/dL (ref 6–23)
CO2: 26 meq/L (ref 19–32)
Calcium: 9.7 mg/dL (ref 8.4–10.5)
Chloride: 103 meq/L (ref 96–112)
Creatinine, Ser: 0.61 mg/dL (ref 0.40–1.20)
GFR: 91.41 mL/min (ref >60.00)
Glucose, Bld: 97 mg/dL (ref 70–99)
Potassium: 3.9 meq/L (ref 3.5–5.1)
Sodium: 141 meq/L (ref 135–145)
Total Bilirubin: 0.6 mg/dL (ref 0.2–1.2)
Total Protein: 7.3 g/dL (ref 6.0–8.3)

## 2024-08-15 ENCOUNTER — Ambulatory Visit: Payer: Self-pay | Admitting: Internal Medicine

## 2024-08-16 LAB — CULTURE, URINE COMPREHENSIVE

## 2024-08-27 ENCOUNTER — Encounter: Payer: Self-pay | Admitting: Internal Medicine

## 2024-08-27 NOTE — Progress Notes (Unsigned)
    Subjective:    Patient ID: Pamela Cameron, female    DOB: [birth year], 68 y.o.   MRN: [id number]      HPI Ruhee is here for No chief complaint on file.   10/6 - UA with probable UTI - started on keflex  500 mg bid x 7 days.  Ucx - Ecoli [id number] pansensitive.   08/16/23 - Ecoli.      Medications and allergies reviewed with patient and updated if appropriate.  Current Outpatient Medications on File Prior to Visit  Medication Sig Dispense Refill   azelaic acid  (AZELEX ) 20 % cream Apply topically 2 (two) times daily. After skin is thoroughly washed and patted dry, gently but thoroughly massage a thin film of azelaic acid  cream into the affected area twice daily, in the morning and evening. 30 g 0   cephALEXin  (KEFLEX ) 500 MG capsule Take 1 capsule (500 mg total) by mouth 2 (two) times daily. 14 capsule 0   Cholecalciferol (VITAMIN D3 PO) Take 50 mcg by mouth daily.     Cyanocobalamin  (VITAMIN B-12) 500 MCG SUBL 500 mcg daily 30 tablet    MAGNESIUM  CITRATE PO Take by mouth.     Omega-3 Fatty Acids (FISH OIL PO) Take by mouth.     Probiotic Product (PROBIOTIC DAILY) CAPS Take 1 tablet by mouth daily.     rosuvastatin  (CRESTOR ) 20 MG tablet Take 0.5 tablets (10 mg total) by mouth daily.     TURMERIC PO Take 1 tablet by mouth daily.     No current facility-administered medications on file prior to visit.    Review of Systems     Objective:  There were no vitals filed for this visit. BP Readings from Last 3 Encounters:  08/13/24 108/74  09/07/23 104/70  08/16/23 106/74   Wt Readings from Last 3 Encounters:  08/13/24 165 lb (74.8 kg)  06/12/24 161 lb (73 kg)  09/07/23 161 lb (73 kg)   There is no height or weight on file to calculate BMI.    Physical Exam         Assessment & Plan:    See Problem List for Assessment and Plan of chronic medical problems.

## 2024-08-27 NOTE — Patient Instructions (Incomplete)
    We will let you know what the culture shows - should be back in 2 days.     Medications changes include :   keflex  500 mg if needed when traveling

## 2024-08-28 ENCOUNTER — Other Ambulatory Visit (INDEPENDENT_AMBULATORY_CARE_PROVIDER_SITE_OTHER): Payer: Self-pay

## 2024-08-28 ENCOUNTER — Ambulatory Visit (INDEPENDENT_AMBULATORY_CARE_PROVIDER_SITE_OTHER): Admitting: Internal Medicine

## 2024-08-28 VITALS — BP 104/78 | HR 54 | Temp 98.0°F | Ht 69.5 in | Wt 163.0 lb

## 2024-08-28 DIAGNOSIS — R300 Dysuria: Secondary | ICD-10-CM | POA: Insufficient documentation

## 2024-08-28 LAB — POC URINALSYSI DIPSTICK (AUTOMATED)
Bilirubin, UA: NEGATIVE
Blood, UA: NEGATIVE
Glucose, UA: NEGATIVE
Ketones, UA: NEGATIVE
Leukocytes, UA: NEGATIVE
Nitrite, UA: NEGATIVE
Protein, UA: NEGATIVE
Spec Grav, UA: 1.025 (ref 1.010–1.025)
Urobilinogen, UA: 0.2 U/dL
pH, UA: 6 (ref 5.0–8.0)

## 2024-08-28 MED ORDER — CEPHALEXIN 500 MG PO CAPS: 500.0000 mg | ORAL_CAPSULE | Freq: Two times a day (BID) | ORAL | 0 refills | Status: AC

## 2024-08-28 MED ORDER — ROSUVASTATIN CALCIUM 10 MG PO TABS: 10.0000 mg | ORAL_TABLET | Freq: Every day | ORAL | 3 refills | Status: AC

## 2024-08-28 NOTE — Assessment & Plan Note (Signed)
 Subacute Recent UTI and completed Keflex  and all of her symptoms improved, but still has a twinge of pain with urination that has persisted No other concerning symptoms Urine dip urine negative for infection-Will send for culture No treatment at this time Can try over-the-counter Azo for discomfort Can consider starting UTI prevention tabs with cranberry, d-mannose, probiotic Depending on results of culture and if symptoms persist we will determine further treatment Keflex  500 mg twice daily x 1 week given for upcoming travel given her increased risk of urinary tract infections given to him last year

## 2024-08-30 LAB — CULTURE, URINE COMPREHENSIVE: RESULT:: NO GROWTH

## 2024-09-10 ENCOUNTER — Encounter: Payer: Self-pay | Admitting: Radiology

## 2024-09-26 ENCOUNTER — Other Ambulatory Visit

## 2024-10-11 ENCOUNTER — Other Ambulatory Visit

## 2024-10-11 DIAGNOSIS — E7849 Other hyperlipidemia: Secondary | ICD-10-CM

## 2024-10-11 LAB — LIPID PANEL
Cholesterol: 139 mg/dL (ref 0–200)
HDL: 73.8 mg/dL (ref >39.00)
LDL Cholesterol: 52 mg/dL (ref 0–99)
NonHDL: 65.35
Total CHOL/HDL Ratio: 2
Triglycerides: 65 mg/dL (ref 0.0–149.0)
VLDL: 13 mg/dL (ref 0.0–40.0)

## 2024-10-11 LAB — HEPATIC FUNCTION PANEL
ALT: 23 U/L (ref 0–35)
AST: 22 U/L (ref 0–37)
Albumin: 4.4 g/dL (ref 3.5–5.2)
Alkaline Phosphatase: 49 U/L (ref 39–117)
Bilirubin, Direct: 0.1 mg/dL (ref 0.0–0.3)
Total Bilirubin: 0.7 mg/dL (ref 0.2–1.2)
Total Protein: 6.6 g/dL (ref 6.0–8.3)

## 2025-06-13 ENCOUNTER — Ambulatory Visit

## 2025-09-02 ENCOUNTER — Ambulatory Visit: Admitting: Internal Medicine
